# Patient Record
Sex: Female | Born: 1990
Health system: Southern US, Community
[De-identification: ages and names within clinical notes are randomized; demographics above are authoritative.]

## PROBLEM LIST (undated history)

## (undated) DIAGNOSIS — R011 Cardiac murmur, unspecified: Secondary | ICD-10-CM

## (undated) DIAGNOSIS — T7840XA Allergy, unspecified, initial encounter: Secondary | ICD-10-CM

## (undated) HISTORY — DX: Allergy, unspecified, initial encounter: T78.40XA

## (undated) HISTORY — DX: Cardiac murmur, unspecified: R01.1

## (undated) HISTORY — PX: NO PAST SURGERIES: SHX2092

---
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
## 2011-10-01 IMAGING — US US ABDOMEN COMPLETE
1 series · 14 of 25 positions shown · non-contrast
Comparison: None.

CLINICAL DATA: Periumbilical pain, nausea, vomiting, diarrhea

ABDOMINAL ULTRASOUND COMPLETE

[Series 1: us abdomen complete · 0.28mm/px · 14 of 75 slices shown]
[im 1/75]
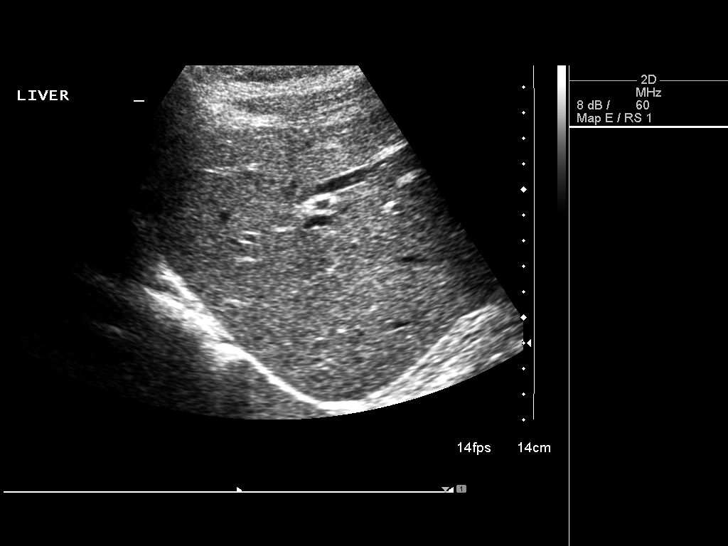
[im 7/75]
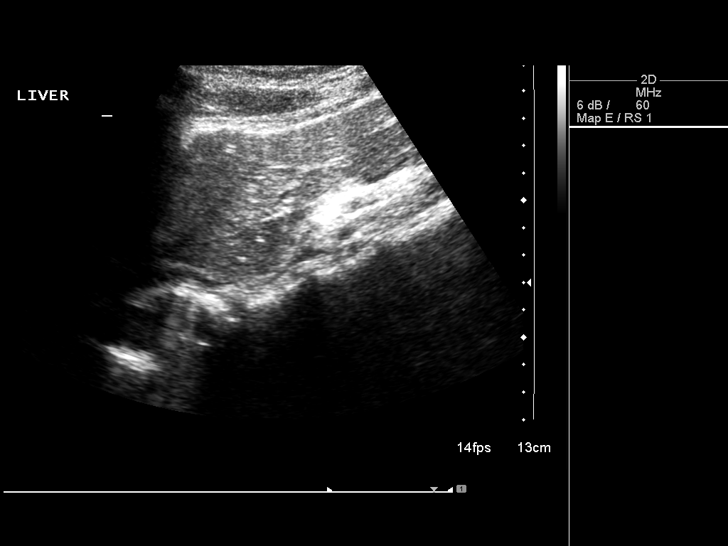
[im 13/75]
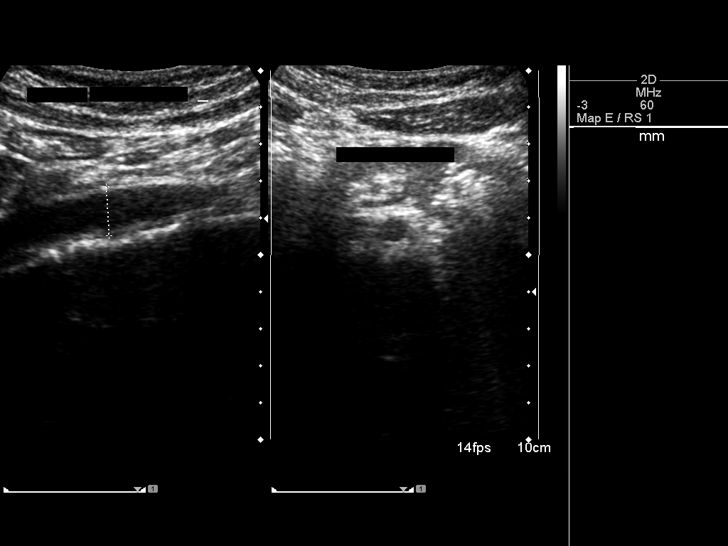
[im 19/75]
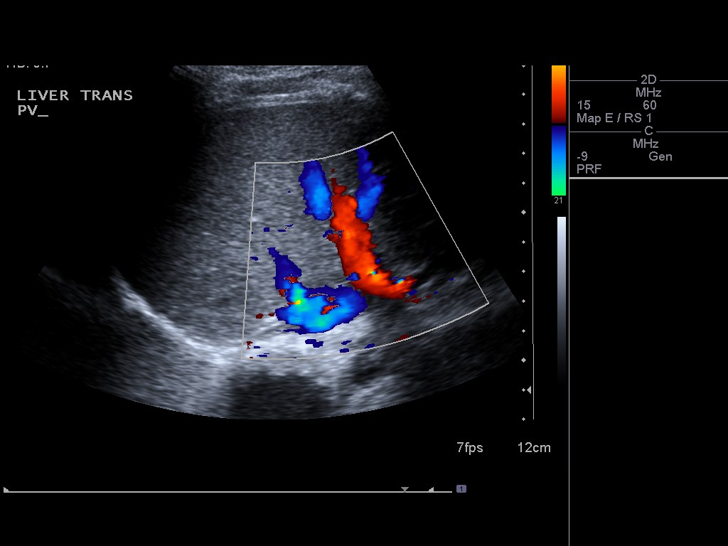
[im 25/75]
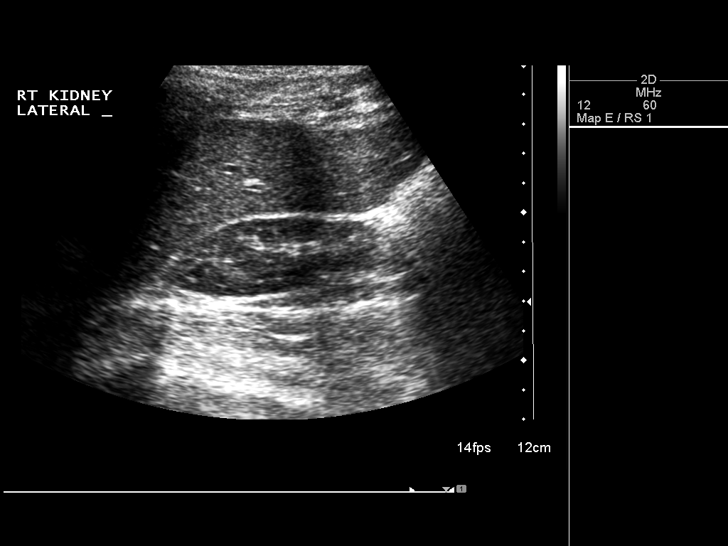
[im 28/75]
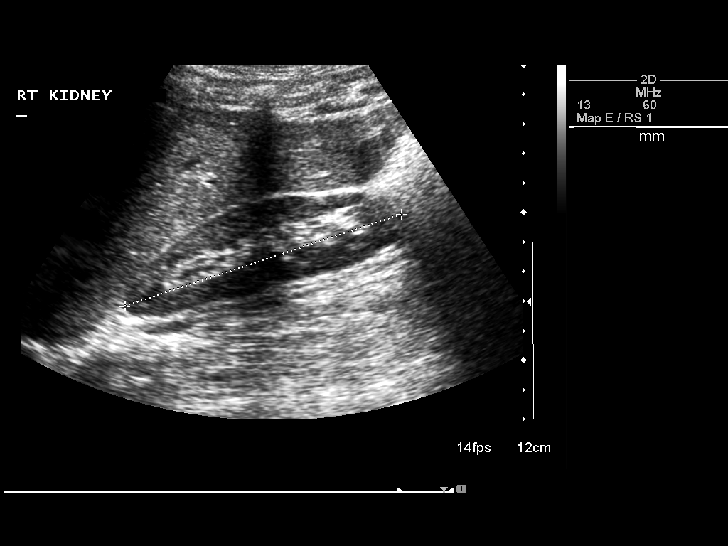
[im 34/75]
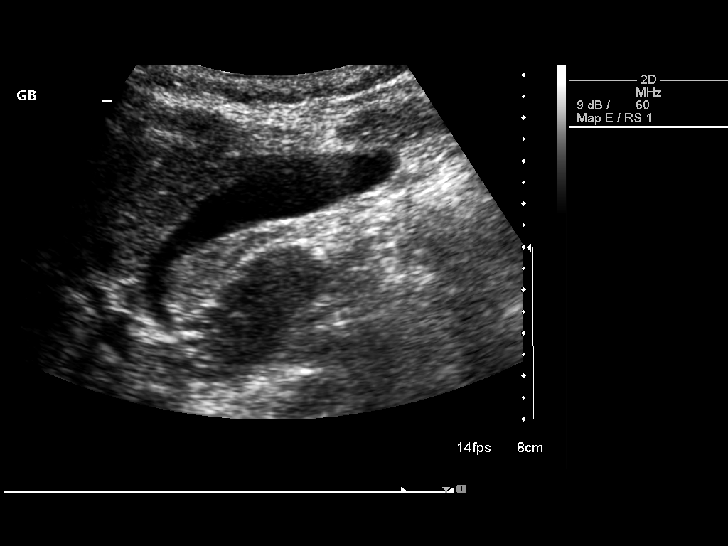
[im 41/75]
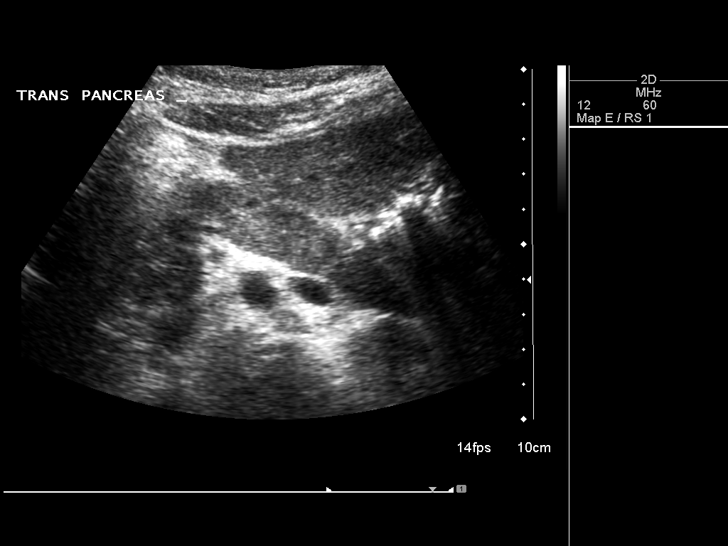
[im 47/75]
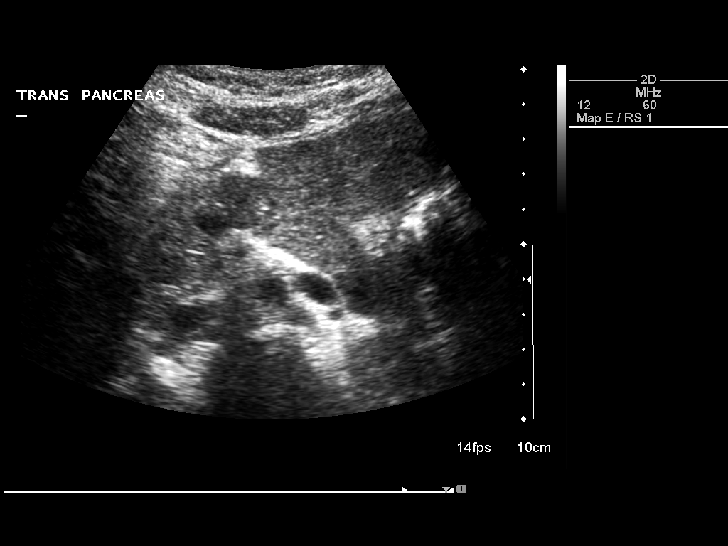
[im 50/75]
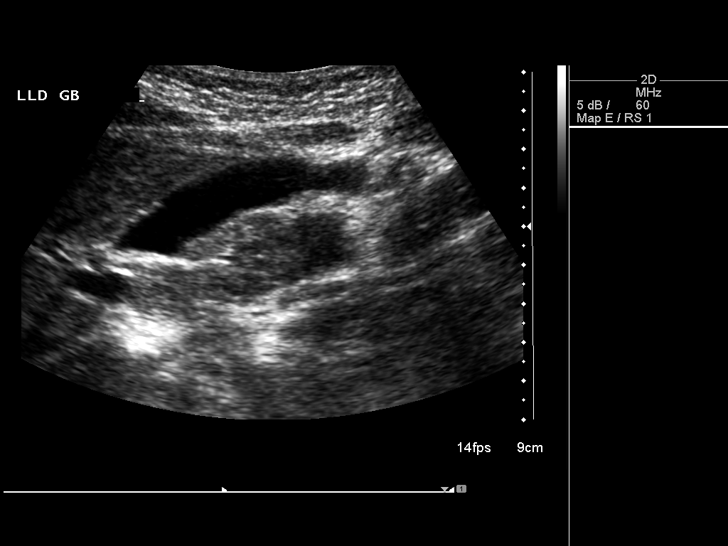
[im 56/75]
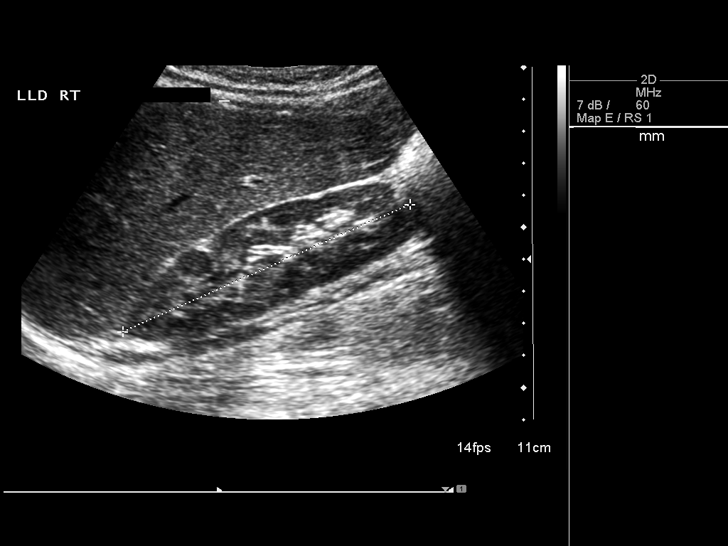
[im 62/75]
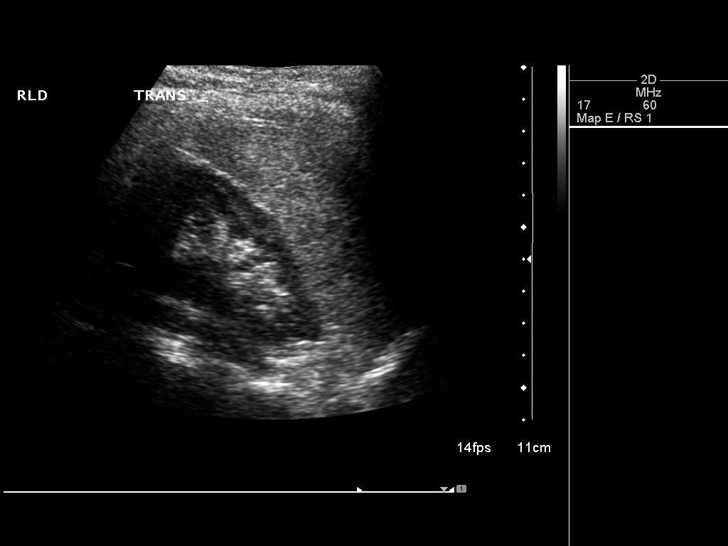
[im 68/75]
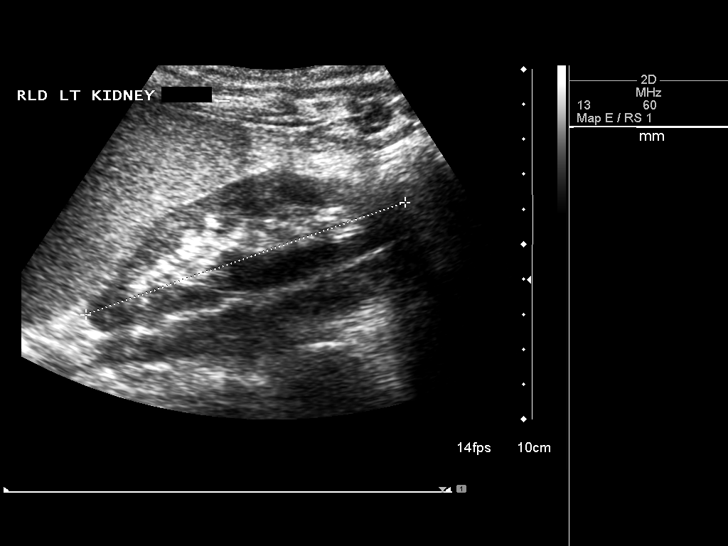
[im 75/75]
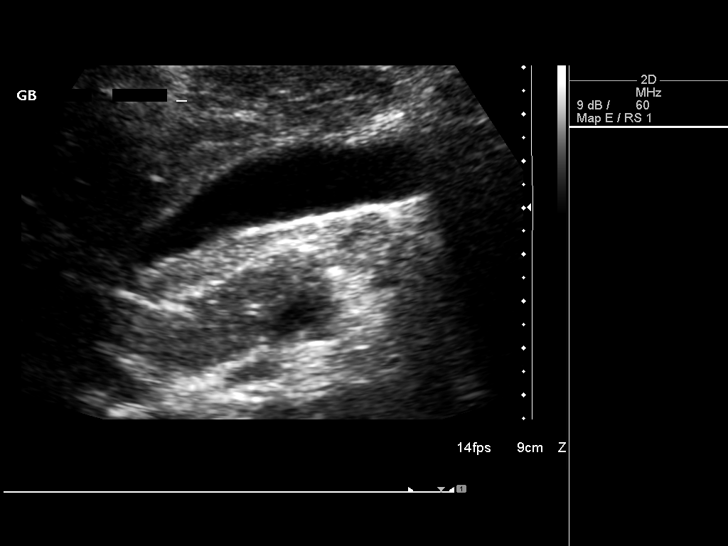

[14 of 25 positions shown; findings below may reference images not displayed]

FINDINGS: Gallbladder:  No gallstones, gallbladder wall thickening, or
pericholecystic fluid.

Common Bile Duct:  Within normal limits in caliber.

Liver: No focal mass lesion identified.  Within normal limits in
parenchymal echogenicity.

IVC:  Appears normal.

Pancreas:  No abnormality identified, although entire pancreas
cannot be visualized by ultrasound.

Spleen:  Within normal limits in size and echotexture.

Right kidney:  Normal in size and parenchymal echogenicity.  No
evidence of mass or hydronephrosis.

Left kidney:  Normal in size and parenchymal echogenicity.  No
evidence of mass or hydronephrosis.

Abdominal Aorta:  No aneurysm identified.
IMPRESSION: Negative abdominal ultrasound.

## 2011-11-09 ENCOUNTER — Ambulatory Visit (INDEPENDENT_AMBULATORY_CARE_PROVIDER_SITE_OTHER): Payer: BC Managed Care – PPO | Admitting: Physician Assistant

## 2011-11-09 VITALS — BP 96/78 | HR 82 | Temp 99.0°F | Resp 16

## 2011-11-09 DIAGNOSIS — Z23 Encounter for immunization: Secondary | ICD-10-CM

## 2011-11-09 DIAGNOSIS — Z111 Encounter for screening for respiratory tuberculosis: Secondary | ICD-10-CM

## 2011-11-12 ENCOUNTER — Encounter: Payer: BC Managed Care – PPO | Admitting: Physician Assistant

## 2011-11-13 LAB — TB SKIN TEST
Induration: 0 mm
TB Skin Test: NEGATIVE

## 2011-11-20 ENCOUNTER — Ambulatory Visit (INDEPENDENT_AMBULATORY_CARE_PROVIDER_SITE_OTHER): Payer: BC Managed Care – PPO | Admitting: Physician Assistant

## 2011-11-20 VITALS — BP 98/64 | HR 76 | Temp 98.5°F | Resp 16 | Ht 62.0 in | Wt 99.0 lb

## 2011-11-20 DIAGNOSIS — Z111 Encounter for screening for respiratory tuberculosis: Secondary | ICD-10-CM

## 2011-11-20 DIAGNOSIS — Z043 Encounter for examination and observation following other accident: Secondary | ICD-10-CM

## 2011-11-22 ENCOUNTER — Encounter (INDEPENDENT_AMBULATORY_CARE_PROVIDER_SITE_OTHER): Payer: BC Managed Care – PPO

## 2011-11-22 DIAGNOSIS — Z111 Encounter for screening for respiratory tuberculosis: Secondary | ICD-10-CM

## 2011-11-22 LAB — TB SKIN TEST: TB Skin Test: NEGATIVE

## 2012-02-07 NOTE — Progress Notes (Signed)
2-step PPD placement

## 2012-02-13 NOTE — Progress Notes (Signed)
Needs 1st TB skin test (requires 2-step).

## 2015-03-24 ENCOUNTER — Ambulatory Visit (INDEPENDENT_AMBULATORY_CARE_PROVIDER_SITE_OTHER): Payer: 59 | Admitting: Physician Assistant

## 2015-03-24 ENCOUNTER — Encounter: Payer: Self-pay | Admitting: Physician Assistant

## 2015-03-24 VITALS — BP 90/60 | HR 97 | Temp 98.6°F | Resp 16 | Ht 62.75 in | Wt 102.6 lb

## 2015-03-24 DIAGNOSIS — G43009 Migraine without aura, not intractable, without status migrainosus: Secondary | ICD-10-CM | POA: Insufficient documentation

## 2015-03-24 MED ORDER — DICLOFENAC SODIUM 75 MG PO TBEC: 75.0000 mg | DELAYED_RELEASE_TABLET | Freq: Two times a day (BID) | ORAL | Status: DC

## 2015-03-24 NOTE — Progress Notes (Signed)
[facility] [address], [place] [address] [phone]  Date:  03/24/2015   Name:  Sandra Hart   DOB:  Dec 28, 1990   MRN:  [id number]  PCP:  No primary care provider on file.    Chief Complaint: Migraine   History of Present Illness:  This is a 24 y.o. female with PMH migraine headaches who is presenting with complaint of migraine. She is wondering if I can help her determine a trigger. She generally gets headaches 1-2 times a month. Usually lasts no longer than 1 day. She recently had a migraine that lasted 2 days and woke her during the night. She is asymptomatic currently. Advil usually helps but didn't this past time. She took excedrin on the 2nd day which she had never tried before and did help. Her headaches are usually occipital. She has photophobia and sensitivity to smell. No phonophobia. Gets nauseated but no vomiting. No blurred vision. No aura. No weakness or numbness. No neck pain with headaches. Sometimes gets migraines around period, other times not related. LMP 12/2 and most recent migraine started 3 days later. She is on OCP since 2009. She didn't notice decreased freq when she started then. She has never kept track of foods she eats. She drinks occ. No smoking or rec drugs. She states she generally sleeps well and is not tired during the day. Mood is good. Drinks plenty of water. She does not drink caffeine   Review of Systems:  Review of Systems See HPI  There are no active problems to display for this patient.   Prior to Admission medications   Medication Sig Start Date End Date Taking? Authorizing Provider  BIOTIN 5000 PO Take by mouth.   Yes Historical Provider, MD  Multiple Vitamin (MULTIVITAMIN) tablet Take 1 tablet by mouth daily.   Yes Historical Provider, MD  NAPROXEN PO Take by mouth.   Yes Historical Provider, MD  Norethin Ace-Eth Estrad-FE (LARIN 24 FE PO) Take by mouth.   Yes Historical Provider, MD    Allergies   Allergen Reactions  . Sulfa Antibiotics     History reviewed. No pertinent past surgical history.  Social History  Substance Use Topics  . Smoking status: Never Smoker   . Smokeless tobacco: None  . Alcohol Use: None    History reviewed. No pertinent family history.  Medication list has been reviewed and updated.  Physical Examination:  Physical Exam  Constitutional: She is oriented to person, place, and time. She appears well-developed and well-nourished. No distress.  HENT:  Head: Normocephalic and atraumatic.  Right Ear: Hearing, tympanic membrane, external ear and ear canal normal.  Left Ear: Hearing, tympanic membrane, external ear and ear canal normal.  Nose: Nose normal.  Mouth/Throat: Uvula is midline, oropharynx is clear and moist and mucous membranes are normal.  Eyes: Conjunctivae, EOM and lids are normal. Pupils are equal, round, and reactive to light. Right eye exhibits no discharge. Left eye exhibits no discharge. No scleral icterus.  Neck: Trachea normal. No thyromegaly present.  Cardiovascular: Normal rate, regular rhythm, normal heart sounds and normal pulses.   No murmur heard. Pulmonary/Chest: Effort normal and breath sounds normal. No respiratory distress. She has no wheezes. She has no rhonchi. She has no rales.  Musculoskeletal: Normal range of motion.  Lymphadenopathy:    She has no cervical adenopathy.  Neurological: She is alert and oriented to person, place, and time. She has normal strength and normal reflexes. No cranial nerve  deficit or sensory deficit. Gait normal.  Skin: Skin is warm, dry and intact. No lesion and no rash noted.  Psychiatric: She has a normal mood and affect. Her speech is normal and behavior is normal. Thought content normal.   BP 90/60 mmHg  Pulse 97  Temp(Src) 98.6 F (37 C) (Oral)  Resp 16  Ht 5' 2.75" (1.594 m)  Wt 102 lb 9.6 oz (46.539 kg)  BMI 18.32 kg/m2  SpO2 95%  LMP 03/18/2015  Assessment and Plan:  1.  Migraine without aura and without status migrainosus, not intractable No definite trigger. Advised she keep a headache diary to identify a trigger. Advil usually helps although not this past time. Gave rx for voltaren to try. She can also try excedrin. She would likely not benefit from preventative treatment since only gets 1-2 migraines a month. Can always try if freq increases. Neuro exam normal. Return as needed. - diclofenac (VOLTAREN) 75 MG EC tablet; Take 1 tablet (75 mg total) by mouth 2 (two) times daily. As needed for migraine  Dispense: 30 tablet; Refill: 0   Benjaman Pott. Drenda Freeze, MHS [facility]  03/24/2015

## 2015-03-24 NOTE — Patient Instructions (Signed)
Take voltaren at start of migraine. May repeat later in the day if needed. Can also try excedrin. Keep a headache diary to see if you can determine a trigger. Try to get 8 hours of sleep a night and drink at least 64 oz water a day. Return if symptoms worsen.  Migraine Headache A migraine headache is an intense, throbbing pain on one or both sides of your head. A migraine can last for 30 minutes to several hours. CAUSES  The exact cause of a migraine headache is not always known. However, a migraine may be caused when nerves in the brain become irritated and release chemicals that cause inflammation. This causes pain. Certain things may also trigger migraines, such as:  Alcohol.  Smoking.  Stress.  Menstruation.  Aged cheeses.  Foods or drinks that contain nitrates, glutamate, aspartame, or tyramine.  Lack of sleep.  Chocolate.  Caffeine.  Hunger.  Physical exertion.  Fatigue.  Medicines used to treat chest pain (nitroglycerine), birth control pills, estrogen, and some blood pressure medicines. SIGNS AND SYMPTOMS  Pain on one or both sides of your head.  Pulsating or throbbing pain.  Severe pain that prevents daily activities.  Pain that is aggravated by any physical activity.  Nausea, vomiting, or both.  Dizziness.  Pain with exposure to bright lights, loud noises, or activity.  General sensitivity to bright lights, loud noises, or smells. Before you get a migraine, you may get warning signs that a migraine is coming (aura). An aura may include:  Seeing flashing lights.  Seeing bright spots, halos, or zigzag lines.  Having tunnel vision or blurred vision.  Having feelings of numbness or tingling.  Having trouble talking.  Having muscle weakness. DIAGNOSIS  A migraine headache is often diagnosed based on:  Symptoms.  Physical exam.  A CT scan or MRI of your head. These imaging tests cannot diagnose migraines, but they can help rule out other  causes of headaches. TREATMENT Medicines may be given for pain and nausea. Medicines can also be given to help prevent recurrent migraines.  HOME CARE INSTRUCTIONS  Only take over-the-counter or prescription medicines for pain or discomfort as directed by your health care provider. The use of long-term narcotics is not recommended.  Lie down in a dark, quiet room when you have a migraine.  Keep a journal to find out what may trigger your migraine headaches. For example, write down:  What you eat and drink.  How much sleep you get.  Any change to your diet or medicines.  Limit alcohol consumption.  Quit smoking if you smoke.  Get 7-9 hours of sleep, or as recommended by your health care provider.  Limit stress.  Keep lights dim if bright lights bother you and make your migraines worse. SEEK IMMEDIATE MEDICAL CARE IF:   Your migraine becomes severe.  You have a fever.  You have a stiff neck.  You have vision loss.  You have muscular weakness or loss of muscle control.  You start losing your balance or have trouble walking.  You feel faint or pass out.  You have severe symptoms that are different from your first symptoms. MAKE SURE YOU:   Understand these instructions.  Will watch your condition.  Will get help right away if you are not doing well or get worse.   This information is not intended to replace advice given to you by your health care provider. Make sure you discuss any questions you have with your health care provider.  Document Released: 04/02/2005 Document Revised: 04/23/2014 Document Reviewed: 12/08/2012 Elsevier Interactive Patient Education [employer].

## 2015-05-12 MED FILL — LARIN 24 FE 1 MG-20 MCG TAB: 1-20 | 84 days supply | Qty: 84 | Fill #2

## 2015-08-05 MED FILL — LARIN 24 FE 1 MG-20 MCG TAB: 1-20 | 84 days supply | Qty: 84 | Fill #3

## 2015-10-26 DIAGNOSIS — Z01419 Encounter for gynecological examination (general) (routine) without abnormal findings: Secondary | ICD-10-CM | POA: Diagnosis not present

## 2015-10-26 DIAGNOSIS — Z681 Body mass index (BMI) 19 or less, adult: Secondary | ICD-10-CM | POA: Diagnosis not present

## 2015-10-26 MED FILL — LARIN 24 FE 1 MG-20 MCG TAB: 1-20 | 84 days supply | Qty: 84 | Fill #0

## 2015-11-02 ENCOUNTER — Ambulatory Visit (INDEPENDENT_AMBULATORY_CARE_PROVIDER_SITE_OTHER): Payer: 59 | Admitting: Physician Assistant

## 2015-11-02 VITALS — BP 110/76 | HR 104 | Temp 97.4°F | Resp 18 | Ht 62.75 in | Wt 103.0 lb

## 2015-11-02 DIAGNOSIS — R42 Dizziness and giddiness: Secondary | ICD-10-CM | POA: Diagnosis not present

## 2015-11-02 LAB — COMPLETE METABOLIC PANEL WITH GFR
ALBUMIN: 4.6 g/dL (ref 3.6–5.1)
ALK PHOS: 60 U/L (ref 33–115)
ALT: 30 U/L — ABNORMAL HIGH (ref 6–29)
AST: 19 U/L (ref 10–30)
BUN: 13 mg/dL (ref 7–25)
CALCIUM: 10 mg/dL (ref 8.6–10.2)
CO2: 26 mmol/L (ref 20–31)
Chloride: 102 mmol/L (ref 98–110)
Creat: 0.9 mg/dL (ref 0.50–1.10)
GFR, EST NON AFRICAN AMERICAN: 89 mL/min (ref >=60)
Glucose, Bld: 81 mg/dL (ref 65–99)
POTASSIUM: 4.3 mmol/L (ref 3.5–5.3)
SODIUM: 139 mmol/L (ref 135–146)
Total Bilirubin: 0.5 mg/dL (ref 0.2–1.2)
Total Protein: 7.7 g/dL (ref 6.1–8.1)

## 2015-11-02 LAB — POCT CBC
GRANULOCYTE PERCENT: 63 % (ref 37–80)
HCT, POC: 43.3 % (ref 37.7–47.9)
Hemoglobin: 15.4 g/dL (ref 12.2–16.2)
Lymph, poc: 2.2 (ref 0.6–3.4)
MCH, POC: 31.4 pg — AB (ref 27–31.2)
MCHC: 35.7 g/dL — AB (ref 31.8–35.4)
MCV: 88 fL (ref 80–97)
MID (CBC): 0.4 (ref 0–0.9)
MPV: 7.4 fL (ref 0–99.8)
POC GRANULOCYTE: 4.4 (ref 2–6.9)
POC LYMPH %: 31.5 % (ref 10–50)
POC MID %: 5.5 %M (ref 0–12)
Platelet Count, POC: 302 10*3/uL (ref 142–424)
RBC: 4.92 M/uL (ref 4.04–5.48)
RDW, POC: 11.6 %
WBC: 7 10*3/uL (ref 4.6–10.2)

## 2015-11-02 LAB — GLUCOSE, POCT (MANUAL RESULT ENTRY): POC Glucose: 85 mg/dl (ref 70–99)

## 2015-11-02 NOTE — Patient Instructions (Signed)
     IF you received an x-ray today, you will receive an invoice from [facility]. Please contact [facility] at [phone] with questions or concerns regarding your invoice.   IF you received labwork today, you will receive an invoice from [facility]. Please contact [facility] at [phone] with questions or concerns regarding your invoice.   Our billing staff will not be able to assist you with questions regarding bills from these companies.  You will be contacted with the lab results as soon as they are available. The fastest way to get your results is to activate your My Chart account. Instructions are located on the last page of this paperwork. If you have not heard from us regarding the results in 2 weeks, please contact this office.      

## 2015-11-02 NOTE — Progress Notes (Signed)
Sandra Hart  MRN: [id number] DOB: 1990/06/09  Subjective:  Pt presents to clinic with several episodes over the last month of lightheadedness and feeling like she was going to pass out but she has not had a syncopal episode.  They have been with different activities and some have been worse than others.  She gets the sensation that she is going to pass out and then she sits or if her sensation is really bad she will end up laying on the ground and elevating her legs but that has only happened once.  It has been time for a meal with all the episodes except for one where she had just eaten dinner.  She has eaten something with most of the episodes and that makes her feels better.  She does not have any other associated symptoms during this attacks - She does have have an irregular heart rate, no anxiety, no SOB or CP.  If she stands up quickly and will sometimes get this sensation but she is not sure if it happening with these episodes.  She has had this for a while but her current symptoms are worse than this and not always with change in position.  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  Neurological: Positive for light-headedness and headaches (intermittent - sometimes related to symptoms like she will get a headache after the incident). Negative for dizziness, syncope, facial asymmetry and numbness.    Patient Active Problem List   Diagnosis Date Noted  . Migraine without aura and without status migrainosus, not intractable 03/24/2015    Current Outpatient Prescriptions on File Prior to Visit  Medication Sig Dispense Refill  . BIOTIN 5000 PO Take by mouth.    . diclofenac (VOLTAREN) 75 MG EC tablet  Take 1 tablet (75 mg total) by mouth 2 (two) times daily. As needed for migraine 30 tablet 0  . Multiple Vitamin (MULTIVITAMIN) tablet Take 1 tablet by mouth daily.    Marland Kitchen NAPROXEN PO Take by mouth.    . Norethin Ace-Eth Estrad-FE (LARIN 24 FE PO) Take by mouth.     No current facility-administered medications on file prior to visit.    Allergies  Allergen Reactions  . Sulfa Antibiotics     Objective:  BP 110/76 mmHg  Pulse 104  Temp(Src) 97.4 F (36.3 C) (Oral)  Resp 18  Ht 5' 2.75" (1.594 m)  Wt 103 lb (46.72 kg)  BMI 18.39 kg/m2  SpO2 100%  LMP 10/28/2015  Orthostatic VS for the past 24 hrs:  BP- Lying Pulse- Lying BP- Sitting Pulse- Sitting BP- Standing at 0 minutes Pulse- Standing at 0 minutes  11/02/15 1053 113/75 mmHg 88 110/74 mmHg 94 109/77 mmHg 98    Physical Exam  Constitutional: She is oriented to person, place, and time and well-developed, well-nourished, and in no distress.  HENT:  Head: Normocephalic and atraumatic.  Right Ear: Hearing and external ear normal.  Left Ear: Hearing and external ear normal.  Eyes: Conjunctivae are normal.  Neck: Normal range of motion.  Cardiovascular: Normal rate, regular rhythm and normal heart sounds.   No murmur heard. Pulmonary/Chest: Effort normal and breath sounds normal. She has no wheezes.  Neurological:  She is alert and oriented to person, place, and time. Gait normal.  Skin: Skin is warm and dry.  Psychiatric: Mood, memory, affect and judgment normal.  Vitals reviewed.  EKG - NSR without any acute changes  Results for orders placed or performed in visit on 11/02/15  POCT glucose (manual entry)  Result Value Ref Range   POC Glucose 85 70 - 99 mg/dl  POCT CBC  Result Value Ref Range   WBC 7.0 4.6 - 10.2 K/uL   Lymph, poc 2.2 0.6 - 3.4   POC LYMPH PERCENT 31.5 10 - 50 %L   MID (cbc) 0.4 0 - 0.9   POC MID % 5.5 0 - 12 %M   POC Granulocyte 4.4 2 - 6.9   Granulocyte percent 63.0 37 - 80 %G   RBC 4.92 4.04 -  5.48 M/uL   Hemoglobin 15.4 12.2 - 16.2 g/dL   HCT, POC 43.3 37.7 - 47.9 %   MCV 88.0 80 - 97 fL   MCH, POC 31.4 (A) 27 - 31.2 pg   MCHC 35.7 (A) 31.8 - 35.4 g/dL   RDW, POC 11.6 %   Platelet Count, POC 302 142 - 424 K/uL   MPV 7.4 0 - 99.8 fL       Assessment and Plan :  Lightheadedness - Plan: POCT glucose (manual entry), POCT CBC, COMPLETE METABOLIC PANEL WITH GFR, EKG 12-Lead, Orthostatic vital signs   Pt will check her BP and pulse when she is having her symptoms.  She is not orthostatic but her pulse does increase with change in position to maintain her BP.  She will try and increase her water intake while she is at work. She will eat her normal meals and then add a protein snack to decrease the chances of a functional hypoglycemia.  She will continue to track and monitor her symptoms.  She will try and increase her salt slightly in her diet to increase her BP since she is having some symptoms with change in position.    D/w Dr Gillis Ends PA-C  [facility] 11/02/2015 11:08 AM

## 2015-11-04 ENCOUNTER — Telehealth: Payer: Self-pay | Admitting: Emergency Medicine

## 2015-11-04 NOTE — Telephone Encounter (Signed)
-----   Message from Mancel Bale, PA-C sent at 11/02/2015  6:27 PM EDT ----- Labs look good.

## 2015-11-04 NOTE — Telephone Encounter (Signed)
Pt given normal blood work 

## 2016-01-19 MED FILL — LARIN 24 FE 1 MG-20 MCG TAB: 1-20 | 84 days supply | Qty: 84 | Fill #1

## 2016-03-15 MED FILL — NAPROXEN SODIUM 550 MG TAB: 550 | 15 days supply | Qty: 30 | Fill #0

## 2016-04-13 MED FILL — BLISOVI 24 FE TABLET: 1-20 | 84 days supply | Qty: 84 | Fill #2

## 2016-07-06 MED FILL — BLISOVI 24 FE TABLET: 1-20 | 84 days supply | Qty: 84 | Fill #3

## 2016-09-27 MED FILL — NAPROXEN SODIUM 550 MG TAB: 550 | 15 days supply | Qty: 30 | Fill #1

## 2016-09-27 MED FILL — BLISOVI 24 FE TABLET: 1-20 | 84 days supply | Qty: 84 | Fill #4

## 2016-11-19 DIAGNOSIS — Z681 Body mass index (BMI) 19 or less, adult: Secondary | ICD-10-CM | POA: Diagnosis not present

## 2016-11-19 DIAGNOSIS — Z01419 Encounter for gynecological examination (general) (routine) without abnormal findings: Secondary | ICD-10-CM | POA: Diagnosis not present

## 2016-12-24 MED FILL — BLISOVI 24 FE TABLET: 1-20 | 84 days supply | Qty: 84 | Fill #0

## 2017-02-15 MED FILL — NAPROXEN SODIUM 550 MG TAB: 550 | 15 days supply | Qty: 30 | Fill #2

## 2017-03-15 MED FILL — BLISOVI 24 FE TABLET: 1-20 | 84 days supply | Qty: 84 | Fill #1

## 2017-05-27 MED FILL — AMOXICILLIN 500 MG CAPSULE: 500 | 10 days supply | Qty: 40 | Fill #0

## 2017-05-27 MED FILL — CHLORHEXIDINE 0.12% RINSE: 0.12 | 16 days supply | Qty: 473 | Fill #0

## 2017-06-07 MED FILL — BLISOVI 24 FE TABLET: 1-20 | 84 days supply | Qty: 84 | Fill #2

## 2017-06-13 ENCOUNTER — Other Ambulatory Visit: Payer: Self-pay

## 2017-06-13 ENCOUNTER — Ambulatory Visit (INDEPENDENT_AMBULATORY_CARE_PROVIDER_SITE_OTHER): Payer: 59 | Admitting: Family Medicine

## 2017-06-13 ENCOUNTER — Encounter: Payer: Self-pay | Admitting: Family Medicine

## 2017-06-13 VITALS — BP 108/64 | HR 104 | Temp 99.4°F | Resp 16 | Ht 62.75 in | Wt 102.6 lb

## 2017-06-13 DIAGNOSIS — J029 Acute pharyngitis, unspecified: Secondary | ICD-10-CM | POA: Diagnosis not present

## 2017-06-13 DIAGNOSIS — J111 Influenza due to unidentified influenza virus with other respiratory manifestations: Secondary | ICD-10-CM

## 2017-06-13 LAB — POCT RAPID STREP A (OFFICE): RAPID STREP A SCREEN: NEGATIVE

## 2017-06-13 MED ORDER — OSELTAMIVIR PHOSPHATE 75 MG PO CAPS: 75.0000 mg | ORAL_CAPSULE | Freq: Two times a day (BID) | ORAL | 0 refills | Status: AC

## 2017-06-13 MED FILL — OSELTAMIVIR PHOSPHATE 75 MG: 75 | 5 days supply | Qty: 10 | Fill #0

## 2017-06-13 NOTE — Progress Notes (Signed)
  Chief Complaint  Patient presents with  . 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  Current Outpatient Medications  Medication Sig Dispense Refill  . Multiple Vitamin (MULTIVITAMIN) tablet Take 1 tablet by mouth daily.    Marland Kitchen NAPROXEN PO Take by mouth.     No current facility-administered medications for this visit.     Allergies:  Allergies  Allergen Reactions  . Sulfa Antibiotics     No past surgical history on file.  Social History   Socioeconomic History  . Marital status: Married    Spouse name: Not on file  . Number of children: Not on file  . Years of education: Not on file  . Highest education level: Not on file  Social Needs  . Financial resource strain: Not on file  . Food insecurity - worry: Not on file  . Food insecurity - inability: Not on file  . Transportation needs - medical: Not on file  . Transportation needs - non-medical: Not on file  Occupational History  . Occupation: Optician, dispensing: [employer]  Tobacco Use  . Smoking status: Never Smoker  . Smokeless tobacco: Never Used  Substance and Sexual Activity  . Alcohol use: Not on file  . Drug use: Not on file  . Sexual activity: Not on file  Other Topics Concern  . 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   ROS Review of Systems See HPI Constitution:see hpi Skin: No rash or itching Eyes: no blurry vision, no double vision GU: no dysuria or hematuria Neuro: no dizziness or headaches all others reviewed and negative   Objective: Vitals:   06/13/17 1631  BP: 108/64  Pulse: (!) 104  Resp: 16  Temp: 99.4 F (37.4 C)  TempSrc: Oral  SpO2: 98%  Weight: 102 lb 9.6 oz (46.5 kg)  Height: 5' 2.75" (1.594 m)    Physical Exam General: alert, oriented, in NAD Head: normocephalic, atraumatic, no sinus tenderness Eyes: EOM intact, no scleral icterus or conjunctival injection Ears: TM clear bilaterally Nose: mucosa nonerythematous, nonedematous Throat: no pharyngeal exudate or erythema Lymph: no posterior auricular, submental or cervical lymph adenopathy Heart: normal rate, normal sinus rhythm, no murmurs Lungs: clear to auscultation bilaterally, no wheezing   Assessment and Plan Sandra Hart was seen today for sore throat/fever.  Diagnoses and all orders for this visit:  Influenza- based on symptoms and vitals will treat for influenza a Work note given Reviewed tamiflu use and side effects  Acute sore throat- rapid strep negative -     POCT rapid strep A  Other orders -     Cancel: Flu Vaccine QUAD 36+ mos IM -     oseltamivir (TAMIFLU) 75 MG capsule; Take 1 capsule (75 mg total) by mouth 2 (two) times daily for 5 days.     [name]

## 2017-06-13 NOTE — Patient Instructions (Addendum)
If you have thyroid disease, diabetes, hypertension, tachycardia or seizure disorder If you take heart meds or ADD meds   You should not take Dextromorphan or Phenylephrine These are common found in decongestant and cold medications This can cause very high pulses and high blood pressure.  Ask you pharmacist to make sure this ingredient is not present    Influenza, Adult Influenza, more commonly known as "the flu," is a viral infection that primarily affects the respiratory tract. The respiratory tract includes organs that help you breathe, such as the lungs, nose, and throat. The flu causes many common cold symptoms, as well as a high fever and body aches. The flu spreads easily from person to person (is contagious). Getting a flu shot (influenza vaccination) every year is the best way to prevent influenza. What are the causes? Influenza is caused by a virus. You can catch the virus by:  Breathing in droplets from an infected person's cough or sneeze.  Touching something that was recently contaminated with the virus and then touching your mouth, nose, or eyes.  What increases the risk? The following factors may make you more likely to get the flu:  Not cleaning your hands frequently with soap and water or alcohol-based hand sanitizer.  Having close contact with many people during cold and flu season.  Touching your mouth, eyes, or nose without washing or sanitizing your hands first.  Not drinking enough fluids or not eating a healthy diet.  Not getting enough sleep or exercise.  Being under a high amount of stress.  Not getting a yearly (annual) flu shot.  You may be at a higher risk of complications from the flu, such as a severe lung infection (pneumonia), if you:  Are over the age of 67.  Are pregnant.  Have a weakened disease-fighting system (immune system). You may have a weakened immune system if you: ? Have HIV or AIDS. ? Are undergoing chemotherapy. ? Aretaking  medicines that reduce the activity of (suppress) the immune system.  Have a long-term (chronic) illness, such as heart disease, kidney disease, diabetes, or lung disease.  Have a liver disorder.  Are obese.  Have anemia.  What are the signs or symptoms? Symptoms of this condition typically last 4-10 days and may include:  Fever.  Chills.  Headache, body aches, or muscle aches.  Sore throat.  Cough.  Runny or congested nose.  Chest discomfort and cough.  Poor appetite.  Weakness or tiredness (fatigue).  Dizziness.  Nausea or vomiting.  How is this diagnosed? This condition may be diagnosed based on your medical history and a physical exam. Your health care provider may do a nose or throat swab test to confirm the diagnosis. How is this treated? If influenza is detected early, you can be treated with antiviral medicine that can reduce the length of your illness and the severity of your symptoms. This medicine may be given by mouth (orally) or through an IV tube that is inserted in one of your veins. The goal of treatment is to relieve symptoms by taking care of yourself at home. This may include taking over-the-counter medicines, drinking plenty of fluids, and adding humidity to the air in your home. In some cases, influenza goes away on its own. Severe influenza or complications from influenza may be treated in a hospital. Follow these instructions at home:  Take over-the-counter and prescription medicines only as told by your health care provider.  Use a cool mist humidifier to add humidity to  the air in your home. This can make breathing easier.  Rest as needed.  Drink enough fluid to keep your urine clear or pale yellow.  Cover your mouth and nose when you cough or sneeze.  Wash your hands with soap and water often, especially after you cough or sneeze. If soap and water are not available, use hand sanitizer.  Stay home from work or school as told by your  health care provider. Unless you are visiting your health care provider, try to avoid leaving home until your fever has been gone for 24 hours without the use of medicine.  Keep all follow-up visits as told by your health care provider. This is important. How is this prevented?  Getting an annual flu shot is the best way to avoid getting the flu. You may get the flu shot in late summer, fall, or winter. Ask your health care provider when you should get your flu shot.  Wash your hands often or use hand sanitizer often.  Avoid contact with people who are sick during cold and flu season.  Eat a healthy diet, drink plenty of fluids, get enough sleep, and exercise regularly. Contact a health care provider if:  You develop new symptoms.  You have: ? Chest pain. ? Diarrhea. ? A fever.  Your cough gets worse.  You produce more mucus.  You feel nauseous or you vomit. Get help right away if:  You develop shortness of breath or difficulty breathing.  Your skin or nails turn a bluish color.  You have severe pain or stiffness in your neck.  You develop a sudden headache or sudden pain in your face or ear.  You cannot stop vomiting. This information is not intended to replace advice given to you by your health care provider. Make sure you discuss any questions you have with your health care provider. Document Released: 03/30/2000 Document Revised: 09/08/2015 Document Reviewed: 01/25/2015 Elsevier Interactive Patient Education  2017 [employer].

## 2017-08-30 MED FILL — BLISOVI 24 FE TABLET: 1-20 | 84 days supply | Qty: 84 | Fill #3

## 2017-09-30 MED FILL — NAPROXEN SODIUM 550 MG TAB: 550 | 15 days supply | Qty: 30 | Fill #0

## 2017-11-20 MED FILL — BLISOVI 24 FE TABLET: 1-20 | 84 days supply | Qty: 84 | Fill #0

## 2017-12-24 DIAGNOSIS — Z681 Body mass index (BMI) 19 or less, adult: Secondary | ICD-10-CM | POA: Diagnosis not present

## 2017-12-24 DIAGNOSIS — Z01419 Encounter for gynecological examination (general) (routine) without abnormal findings: Secondary | ICD-10-CM | POA: Diagnosis not present

## 2018-02-11 DIAGNOSIS — H52222 Regular astigmatism, left eye: Secondary | ICD-10-CM | POA: Diagnosis not present

## 2018-02-11 DIAGNOSIS — H5213 Myopia, bilateral: Secondary | ICD-10-CM | POA: Diagnosis not present

## 2018-02-12 MED FILL — BLISOVI 24 FE TABLET: 1-20 | 84 days supply | Qty: 84 | Fill #0

## 2018-05-01 MED FILL — BLISOVI 24 FE 1-20 MG-MCG(2: 1-20 | 84 days supply | Qty: 84 | Fill #1

## 2018-05-29 MED FILL — NAPROXEN SODIUM 550 MG TABS: 550 | 15 days supply | Qty: 30 | Fill #1

## 2018-06-05 DIAGNOSIS — S0501XA Injury of conjunctiva and corneal abrasion without foreign body, right eye, initial encounter: Secondary | ICD-10-CM | POA: Diagnosis not present

## 2018-07-17 MED FILL — BLISOVI 24 FE 1-20 MG-MCG(2: 1-20 | 84 days supply | Qty: 84 | Fill #2

## 2018-10-21 MED FILL — BLISOVI 24 FE 1-20 MG-MCG(2: 1-20 | 84 days supply | Qty: 84 | Fill #3

## 2018-11-25 ENCOUNTER — Ambulatory Visit (INDEPENDENT_AMBULATORY_CARE_PROVIDER_SITE_OTHER): Payer: 59 | Admitting: Sports Medicine

## 2018-11-25 ENCOUNTER — Other Ambulatory Visit: Payer: Self-pay

## 2018-11-25 ENCOUNTER — Encounter: Payer: Self-pay | Admitting: Sports Medicine

## 2018-11-25 VITALS — BP 100/60 | Ht 62.0 in | Wt 100.0 lb

## 2018-11-25 DIAGNOSIS — M7741 Metatarsalgia, right foot: Secondary | ICD-10-CM | POA: Diagnosis not present

## 2018-11-25 DIAGNOSIS — M7742 Metatarsalgia, left foot: Secondary | ICD-10-CM

## 2018-11-25 DIAGNOSIS — M216X1 Other acquired deformities of right foot: Secondary | ICD-10-CM | POA: Diagnosis not present

## 2018-11-25 DIAGNOSIS — M216X2 Other acquired deformities of left foot: Secondary | ICD-10-CM

## 2018-11-25 NOTE — Progress Notes (Signed)
   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  She has had this pain ever since she started nursing school about 8 years ago.  She has played with purchasing different shoes and over-the-counter orthotics with minimal relief.  She cycles between a pair of sneakers, and 2 pairs of more rigid soled shoes that she wears at work.  Aggravating factors include the day after a 12-hour shift or doing a cardio workout at the gym.  Alleviating factors include rest.  He denies any injury to her feet, ankle, knees, or back.  She denies numbness, weakness, radiating pain, skin changes, swelling, spasm.     ROS:     See HPI.   PERTINENT  PMH / PSH / FH / SH:  Past Medical, Surgical, Social, and Family History Reviewed & Updated in the EMR.  Pertinent findings include:  None   OBJECTIVE:  BP 100/60   Ht 5\' 2"  (1.575 m)   Wt 100 lb (45.4 kg)   BMI 18.29 kg/m  Physical Exam:  Vital signs are reviewed.   GEN: Alert and oriented, NAD Pulm: Breathing unlabored PSY: normal mood, congruent affect  MSK: Bilateral foot exam: Sensation intact bilaterally Full strength and range of motion in ankle and toes Navicular drop on his right foot with full weightbearing Wide forefoot bilaterally, worse on right with transverse arch collapse Left callus between fourth and fifth metatarsal heads on bottom of foot Pronates on right foot, worse than the left when ambulating Neurovascularly intact  ASSESSMENT & PLAN:   1. Bilateral feet pain Likely contributed by metatarsalgia with flattened transverse  arch.  Given the chronicity of her symptoms and physical exam findings as above we provided patient with green orthotics with metatarsal and scaphoid pads for both feet.  She has had no issues with immediate relief when walking on new orthotics.  She will follow-up in 1 month, if she noted relief from over-the-counter orthotic, can consider making custom orthotics.   Lanier Clam, DO, ATC Sports Medicine Fellow  Patient seen and evaluated with the sports medicine fellow.  I agree with the above plan of care.  Patient definitely needs longitudinal arch support.  She also has collapse of the transverse arches bilaterally.  We are going to start with a simple green sports insole with a scaphoid pad and metatarsal pad.  Patient will return to the office in 1 month for follow-up.  If she finds the temporary orthotics to be comfortable then we will plan on making custom orthotics at follow-up.

## 2018-12-25 ENCOUNTER — Ambulatory Visit (INDEPENDENT_AMBULATORY_CARE_PROVIDER_SITE_OTHER): Payer: 59 | Admitting: Sports Medicine

## 2018-12-25 ENCOUNTER — Other Ambulatory Visit: Payer: Self-pay

## 2018-12-25 VITALS — BP 102/62 | Ht 62.0 in | Wt 105.0 lb

## 2018-12-25 DIAGNOSIS — M216X1 Other acquired deformities of right foot: Secondary | ICD-10-CM | POA: Diagnosis not present

## 2018-12-25 DIAGNOSIS — M7742 Metatarsalgia, left foot: Secondary | ICD-10-CM | POA: Diagnosis not present

## 2018-12-25 DIAGNOSIS — M7741 Metatarsalgia, right foot: Secondary | ICD-10-CM

## 2018-12-25 DIAGNOSIS — M216X2 Other acquired deformities of left foot: Secondary | ICD-10-CM | POA: Diagnosis not present

## 2018-12-25 NOTE — Progress Notes (Signed)
Patient presents today for custom orthotics. Please see visit from 8/11 for full history, exam, and assessment.   Patient was fitted for a: standard, cushioned, semi-rigid orthotic. The orthotic was heated and afterward the patient stood on the orthotic blank positioned on the orthotic stand. The patient was positioned in subtalar neutral position and 10 degrees of ankle dorsiflexion in a weight bearing stance. After completion of molding, a stable base was applied to the orthotic blank. The blank was ground to a stable position for weight bearing. Size: 6 Base: blue EVA Posting: none Additional orthotic padding: metatarsal pads bilaterally  Gait was neutral with orthotics in place. Patient found them to be comfortable. Follow-up as needed.  Patient requesting second pair to be made today but we only had one pair of size 6 orthotics. Will call her when we have them back in stock.  Patient seen and evaluated with the sports medicine fellow.  I agree with the above plan of care.  Patient will return to the office in a few days for a second pair of custom orthotics.

## 2019-01-14 MED FILL — NAPROXEN SODIUM 550 MG TABS: 550 | 10 days supply | Qty: 20 | Fill #0

## 2019-01-14 MED FILL — BLISOVI 24 FE 1-20 MG-MCG(2: 1-20 | 28 days supply | Qty: 28 | Fill #0

## 2019-01-15 ENCOUNTER — Other Ambulatory Visit: Payer: Self-pay

## 2019-01-15 ENCOUNTER — Ambulatory Visit (INDEPENDENT_AMBULATORY_CARE_PROVIDER_SITE_OTHER): Payer: 59 | Admitting: Sports Medicine

## 2019-01-15 VITALS — BP 100/62 | Ht 62.0 in | Wt 105.0 lb

## 2019-01-15 DIAGNOSIS — M7741 Metatarsalgia, right foot: Secondary | ICD-10-CM

## 2019-01-15 DIAGNOSIS — M216X2 Other acquired deformities of left foot: Secondary | ICD-10-CM

## 2019-01-15 DIAGNOSIS — M7742 Metatarsalgia, left foot: Secondary | ICD-10-CM

## 2019-01-15 DIAGNOSIS — M216X1 Other acquired deformities of right foot: Secondary | ICD-10-CM

## 2019-01-15 NOTE — Progress Notes (Signed)
  Patient comes in today for a second pair of custom orthotics.  She has a history of metatarsalgia and pes pronation.  Please see previous office notes for details regarding history and physical exam findings.  Custom orthotics were created for her today.  She found them to be comfortable prior to leaving the office.  Gait was neutral with orthotics in place.  Follow-up as needed.  Patient was fitted for a : standard, cushioned, semi-rigid orthotic. The orthotic was heated and afterward the patient stood on the orthotic blank positioned on the orthotic stand. The patient was positioned in subtalar neutral position and 10 degrees of ankle dorsiflexion in a weight bearing stance. After completion of molding, a stable base was applied to the orthotic blank. The blank was ground to a stable position for weight bearing. Size: 6 Base: Blue EVA Posting: none Additional orthotic padding: none

## 2019-01-22 DIAGNOSIS — Z01419 Encounter for gynecological examination (general) (routine) without abnormal findings: Secondary | ICD-10-CM | POA: Diagnosis not present

## 2019-01-22 DIAGNOSIS — Z681 Body mass index (BMI) 19 or less, adult: Secondary | ICD-10-CM | POA: Diagnosis not present

## 2019-01-25 DIAGNOSIS — M545 Low back pain: Secondary | ICD-10-CM | POA: Diagnosis not present

## 2019-02-12 MED FILL — BLISOVI 24 FE 1-20 MG-MCG(2: 1-20 | 84 days supply | Qty: 84 | Fill #0

## 2019-03-19 DIAGNOSIS — L814 Other melanin hyperpigmentation: Secondary | ICD-10-CM | POA: Diagnosis not present

## 2019-03-19 DIAGNOSIS — B070 Plantar wart: Secondary | ICD-10-CM | POA: Diagnosis not present

## 2019-03-19 DIAGNOSIS — L906 Striae atrophicae: Secondary | ICD-10-CM | POA: Diagnosis not present

## 2019-03-19 DIAGNOSIS — L858 Other specified epidermal thickening: Secondary | ICD-10-CM | POA: Diagnosis not present

## 2019-03-19 DIAGNOSIS — D225 Melanocytic nevi of trunk: Secondary | ICD-10-CM | POA: Diagnosis not present

## 2019-04-13 DIAGNOSIS — H5213 Myopia, bilateral: Secondary | ICD-10-CM | POA: Diagnosis not present

## 2019-05-07 MED FILL — NAPROXEN SODIUM 550 MG TABS: 550 | 10 days supply | Qty: 20 | Fill #0

## 2019-05-07 MED FILL — BLISOVI 24 FE 1-20 MG-MCG(2: 1-20 | 84 days supply | Qty: 84 | Fill #1

## 2019-07-30 MED FILL — BLISOVI 24 FE 1-20 MG-MCG(2: 1-20 | 84 days supply | Qty: 84 | Fill #2

## 2019-10-01 MED FILL — NAPROXEN SODIUM 550 MG TABS: 550 | 10 days supply | Qty: 20 | Fill #0

## 2019-10-03 DIAGNOSIS — M542 Cervicalgia: Secondary | ICD-10-CM | POA: Diagnosis not present

## 2019-10-14 MED FILL — TARINA 24 FE 1-20 MG-MCG(24: 1-20 | 84 days supply | Qty: 84 | Fill #3

## 2019-10-20 DIAGNOSIS — M545 Low back pain: Secondary | ICD-10-CM | POA: Diagnosis not present

## 2019-10-20 DIAGNOSIS — M542 Cervicalgia: Secondary | ICD-10-CM | POA: Diagnosis not present

## 2019-10-20 MED FILL — NAPROXEN SODIUM 550 MG TABS: 550 | 20 days supply | Qty: 40 | Fill #0

## 2019-10-20 MED FILL — METHOCARBAMOL 500 MG TABS: 500 | 20 days supply | Qty: 60 | Fill #0

## 2019-10-26 DIAGNOSIS — M542 Cervicalgia: Secondary | ICD-10-CM | POA: Diagnosis not present

## 2019-10-26 DIAGNOSIS — M545 Low back pain: Secondary | ICD-10-CM | POA: Diagnosis not present

## 2019-11-06 DIAGNOSIS — M545 Low back pain: Secondary | ICD-10-CM | POA: Diagnosis not present

## 2019-11-06 DIAGNOSIS — M542 Cervicalgia: Secondary | ICD-10-CM | POA: Diagnosis not present

## 2019-11-11 ENCOUNTER — Other Ambulatory Visit: Payer: Self-pay

## 2019-11-11 ENCOUNTER — Encounter: Payer: Self-pay | Admitting: Dietician

## 2019-11-11 ENCOUNTER — Encounter: Payer: 59 | Attending: Obstetrics and Gynecology | Admitting: Dietician

## 2019-11-11 DIAGNOSIS — M545 Low back pain: Secondary | ICD-10-CM | POA: Diagnosis not present

## 2019-11-11 DIAGNOSIS — M542 Cervicalgia: Secondary | ICD-10-CM | POA: Diagnosis not present

## 2019-11-11 DIAGNOSIS — Z713 Dietary counseling and surveillance: Secondary | ICD-10-CM | POA: Insufficient documentation

## 2019-11-11 NOTE — Patient Instructions (Signed)
Keep an eye on your water intake throughout the day to get a feel for how much you are drinking.   Remember that a balance of carbohydrates and protein in the morning is great for breakfast.   Aim for whole foods (vs processed foods) for more fiber and less added sugar for nutrition and GI health!

## 2019-11-11 NOTE — Progress Notes (Signed)
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 States that convenient food options are best for days that she works. Tries to grab something quick for breakfast so she doesn't wake up her family in the mornings before work. States she is a picky eater, avoids some foods such as tomatoes, nuts, and beans due to texture. Overall eats a variety of foods. Breakfast and snack options may be processed, such as bag of mini muffins, applesauce, Cheez-its, etc. Also eats veggies with meals/snacks, and chooses low-sugar yogurt options.  Asked about good protein shake/powder brands. Asked about pre/post workout foods for energy and to refuel. Asked about appropriate daily fluid intake for her.   Estimated daily fluid intake: unknown  Supplements:MVI, probiotic, biotin  Physical activity:   24-Hr Dietary Recall First Meal:mini muffins + yogurt + OJ (or ham and veggie omelet)  Snack:protein shake (or Cheez-Its, or carrots)  Second Meal:grilled chicken + green beans/ squash/ zucchini + applesauce Snack:-  Third Meal:stir fry chicken (or roast) (or burger)  Snack:couple bites of chocolate  Beverages:water, OJ, sweet tea   Estimated Energy Needs Calories: 2000-2200 Carbohydrate: 250-275g Protein: 125-138g Fat: 67-73g    NUTRITION EDUCATION &GOALS  Track water/fluid intake to determine current daily intake  Pre/post exercise foods   Importance of whole vs processed foods for nutrition and fiber  Balanced breakfasts (carbohydrates + protein) and meals (vegetables + carbohydrates + protein)     Learning Style & Readiness for Change Teaching method  utilized: Visual &Auditory  Demonstrated degree of understanding ERQ:SXQKS Back  Barriers to learning/adherence to lifestyle change: None Identified   Educational Handouts Provided  MyPlate   Protein Shakes/Powders    MONITORING & EVALUATION Dietary intake, weekly physical activity, and goalsat next nutrition visit.  [employer] Employee is toreturn to [facility] for 2nd Employee Wellness visit.

## 2019-11-12 ENCOUNTER — Encounter: Payer: Self-pay | Admitting: Family Medicine

## 2019-11-12 ENCOUNTER — Other Ambulatory Visit: Payer: Self-pay

## 2019-11-12 ENCOUNTER — Ambulatory Visit: Payer: 59 | Admitting: Family Medicine

## 2019-11-12 VITALS — BP 109/77 | HR 95 | Temp 98.3°F | Ht 62.0 in | Wt 108.4 lb

## 2019-11-12 DIAGNOSIS — Z0000 Encounter for general adult medical examination without abnormal findings: Secondary | ICD-10-CM | POA: Diagnosis not present

## 2019-11-12 NOTE — Patient Instructions (Addendum)
   If you have lab work done today you will be contacted with your lab results within the next 2 weeks.  If you have not heard from us then please contact us. The fastest way to get your results is to register for My Chart.   IF you received an x-ray today, you will receive an invoice from [facility]. Please contact [facility] at [phone] with questions or concerns regarding your invoice.   IF you received labwork today, you will receive an invoice from [facility]. Please contact [facility] at [phone] with questions or concerns regarding your invoice.   Our billing staff will not be able to assist you with questions regarding bills from these companies.  You will be contacted with the lab results as soon as they are available. The fastest way to get your results is to activate your My Chart account. Instructions are located on the last page of this paperwork. If you have not heard from us regarding the results in 2 weeks, please contact this office.     Preventive Care 21-39 Years Old, Female Preventive care refers to visits with your health care provider and lifestyle choices that can promote health and wellness. This includes:  A yearly physical exam. This may also be called an annual well check.  Regular dental visits and eye exams.  Immunizations.  Screening for certain conditions.  Healthy lifestyle choices, such as eating a healthy diet, getting regular exercise, not using drugs or products that contain nicotine and tobacco, and limiting alcohol use. What can I expect for my preventive care visit? Physical exam Your health care provider will check your:  Height and weight. This may be used to calculate body mass index (BMI), which tells if you are at a healthy weight.  Heart rate and blood pressure.  Skin for abnormal spots. Counseling Your health care provider may ask you questions about your:  Alcohol, tobacco, and drug use.  Emotional  well-being.  Home and relationship well-being.  Sexual activity.  Eating habits.  Work and work environment.  Method of birth control.  Menstrual cycle.  Pregnancy history. What immunizations do I need?  Influenza (flu) vaccine  This is recommended every year. Tetanus, diphtheria, and pertussis (Tdap) vaccine  You may need a Td booster every 10 years. Varicella (chickenpox) vaccine  You may need this if you have not been vaccinated. Human papillomavirus (HPV) vaccine  If recommended by your health care provider, you may need three doses over 6 months. Measles, mumps, and rubella (MMR) vaccine  You may need at least one dose of MMR. You may also need a second dose. Meningococcal conjugate (MenACWY) vaccine  One dose is recommended if you are age 19-21 years and a first-year college student living in a residence hall, or if you have one of several medical conditions. You may also need additional booster doses. Pneumococcal conjugate (PCV13) vaccine  You may need this if you have certain conditions and were not previously vaccinated. Pneumococcal polysaccharide (PPSV23) vaccine  You may need one or two doses if you smoke cigarettes or if you have certain conditions. Hepatitis A vaccine  You may need this if you have certain conditions or if you travel or work in places where you may be exposed to hepatitis A. Hepatitis B vaccine  You may need this if you have certain conditions or if you travel or work in places where you may be exposed to hepatitis B. Haemophilus influenzae type b (Hib) vaccine  You   may need this if you have certain conditions. You may receive vaccines as individual doses or as more than one vaccine together in one shot (combination vaccines). Talk with your health care provider about the risks and benefits of combination vaccines. What tests do I need?  Blood tests  Lipid and cholesterol levels. These may be checked every 5 years starting at age  20.  Hepatitis C test.  Hepatitis B test. Screening  Diabetes screening. This is done by checking your blood sugar (glucose) after you have not eaten for a while (fasting).  Sexually transmitted disease (STD) testing.  BRCA-related cancer screening. This may be done if you have a family history of breast, ovarian, tubal, or peritoneal cancers.  Pelvic exam and Pap test. This may be done every 3 years starting at age 21. Starting at age 30, this may be done every 5 years if you have a Pap test in combination with an HPV test. Talk with your health care provider about your test results, treatment options, and if necessary, the need for more tests. Follow these instructions at home: Eating and drinking   Eat a diet that includes fresh fruits and vegetables, whole grains, lean protein, and low-fat dairy.  Take vitamin and mineral supplements as recommended by your health care provider.  Do not drink alcohol if: ? Your health care provider tells you not to drink. ? You are pregnant, may be pregnant, or are planning to become pregnant.  If you drink alcohol: ? Limit how much you have to 0-1 drink a day. ? Be aware of how much alcohol is in your drink. In the [place] one drink equals one 12 oz bottle of beer (355 mL), one 5 oz glass of wine (148 mL), or one 1 oz glass of hard liquor (44 mL). Lifestyle  Take daily care of your teeth and gums.  Stay active. Exercise for at least 30 minutes on 5 or more days each week.  Do not use any products that contain nicotine or tobacco, such as cigarettes, e-cigarettes, and chewing tobacco. If you need help quitting, ask your health care provider.  If you are sexually active, practice safe sex. Use a condom or other form of birth control (contraception) in order to prevent pregnancy and STIs (sexually transmitted infections). If you plan to become pregnant, see your health care provider for a preconception visit. What's next?  Visit your health  care provider once a year for a well check visit.  Ask your health care provider how often you should have your eyes and teeth checked.  Stay up to date on all vaccines. This information is not intended to replace advice given to you by your health care provider. Make sure you discuss any questions you have with your health care provider. Document Revised: 12/12/2017 Document Reviewed: 12/12/2017 Elsevier Patient Education  2020 Elsevier Inc.  

## 2019-11-12 NOTE — Progress Notes (Signed)
7/29/20218:32 AM  Sandra Hart 16-Dec-1990, 29 y.o., female [id number]  Chief Complaint  Patient presents with  . Annual Exam    scheduled for pap nx month with [facility]. Will upload covid vacc card to mychart    HPI:   Patient is a 29 y.o. 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 Influenza-Unspecified 01/15/2015  . PPD Test 11/20/2011  . Tdap 04/16/2008   Frequency of Dental evaluation: Q6 months Frequency of Eye evaluation: yearly   Hearing Screening   125Hz  250Hz  500Hz  1000Hz  2000Hz  3000Hz  4000Hz  6000Hz  8000Hz   Right ear:           Left ear:             Visual Acuity Screening   Right eye Left eye Both eyes  Without correction: 20/25 20/20 20/20   With correction:       Health Maintenance Due  Topic Date Due  . PAP-Cervical Cytology Screening  Never done    Depression screen [facility] 2/9 11/11/2019 06/13/2017 11/02/2015  Decreased Interest 0 0 0  Down, Depressed, Hopeless 0 0 0  PHQ - 2 Score 0 0 0    Fall Risk  11/12/2019 11/11/2019 06/13/2017 11/02/2015 03/24/2015  Falls in the past year? 0 0 No No No  Number falls in past yr: 0 - - - -  Injury with Fall? 0 - - - -     Allergies  Allergen Reactions  . Sulfa Antibiotics     Prior to Admission medications   Medication Sig Start Date End Date Taking? Authorizing Provider  Biotin 1 MG CAPS biotin   Yes [provider]  Multiple Vitamin (MULTIVITAMIN) tablet Take 1 tablet by mouth daily.   Yes [provider]  NAPROXEN PO Take by mouth daily as needed.    Yes [provider]  Norethin Ace-Eth Estrad-FE (BLISOVI 24 FE PO) Take 1 mg by mouth  every morning.   Yes [provider]    No past medical history on file.  No past surgical history on file.  Social History   Tobacco Use  . Smoking status: Never Smoker  . Smokeless tobacco: Never Used  Substance Use Topics  . Alcohol use: Not on file    Family History  Problem Relation Age of Onset  . Hypertension Mother   . Hypertension Father   . Healthy Sister     Review of Systems  Constitutional: Negative for chills, fever and malaise/fatigue.  HENT: Negative for hearing loss, sinus pain, sore throat and tinnitus.   Eyes: Negative for blurred vision and double vision.  Respiratory: Negative for cough and shortness of breath.   Cardiovascular: Negative for chest pain, palpitations and leg swelling.  Gastrointestinal: Negative for abdominal pain, blood in stool, constipation, diarrhea, melena, nausea and vomiting.  Genitourinary: Negative for dysuria and hematuria.  Musculoskeletal: Positive for back pain (going to PT, doing cupping). Negative for myalgias.  Skin: Negative for rash.  Neurological: Positive for headaches (migraines, increased frequency in summer (heat and bright sun), resolve with advil or excedrin). Negative for dizziness, sensory change, speech change and focal weakness.  Endo/Heme/Allergies: Negative for polydipsia.  Psychiatric/Behavioral: Negative for depression.  The patient is not nervous/anxious and does not have insomnia.   All other systems reviewed and are negative.    OBJECTIVE:  Today's Vitals   11/12/19 0824  BP: 109/77  Pulse: 95  Temp: 98.3 F (36.8 C)  SpO2: 98%  Weight: 108 lb 6.4 oz (49.2 kg)  Height: 5\' 2"  (1.575 m)   Body mass index is 19.83 kg/m.   Wt Readings from Last 3 Encounters:  11/12/19 108 lb 6.4 oz (49.2 kg)  11/11/19 108 lb 11.2 oz (49.3 kg)  01/15/19 105 lb (47.6 kg)     Physical Exam Vitals and nursing note reviewed.  Constitutional:      Appearance: She is well-developed.  HENT:      Head: Normocephalic and atraumatic.     Right Ear: Hearing, tympanic membrane, ear canal and external ear normal.     Left Ear: Hearing, tympanic membrane, ear canal and external ear normal.     Mouth/Throat:     Mouth: Mucous membranes are moist.     Pharynx: No oropharyngeal exudate or posterior oropharyngeal erythema.  Eyes:     Extraocular Movements: Extraocular movements intact.     Conjunctiva/sclera: Conjunctivae normal.     Pupils: Pupils are equal, round, and reactive to light.  Neck:     Thyroid: No thyromegaly.  Cardiovascular:     Rate and Rhythm: Normal rate and regular rhythm.     Heart sounds: Normal heart sounds. No murmur heard.  No friction rub. No gallop.   Pulmonary:     Effort: Pulmonary effort is normal.     Breath sounds: Normal breath sounds. No wheezing, rhonchi or rales.  Abdominal:     General: Bowel sounds are normal. There is no distension.     Palpations: Abdomen is soft. There is no hepatomegaly, splenomegaly or mass.     Tenderness: There is no abdominal tenderness.  Musculoskeletal:        General: Normal range of motion.     Cervical back: Neck supple.     Right lower leg: No edema.     Left lower leg: No edema.  Lymphadenopathy:     Cervical: No cervical adenopathy.  Skin:    General: Skin is warm and dry.  Neurological:     Mental Status: She is alert and oriented to person, place, and time.     Cranial Nerves: No cranial nerve deficit.     Gait: Gait normal.     Deep Tendon Reflexes: Reflexes are normal and symmetric.  Psychiatric:        Mood and Affect: Mood normal.        Behavior: Behavior normal.     No results found for this or any previous visit (from the past 24 hour(s)).  No results found.   ASSESSMENT and PLAN  1. Annual physical exam No concerns per history or exam.  HCM reviewed/discussed. Anticipatory guidance regarding healthy weight, lifestyle and choices given.   Other orders   Return in about 1 year (around  11/11/2020).    Rutherford Guys, MD [facility] [place], [place] [address] Ph.  [phone] Fax [phone]

## 2019-11-18 DIAGNOSIS — M545 Low back pain: Secondary | ICD-10-CM | POA: Diagnosis not present

## 2019-11-18 DIAGNOSIS — M542 Cervicalgia: Secondary | ICD-10-CM | POA: Diagnosis not present

## 2019-11-19 ENCOUNTER — Encounter: Payer: 59 | Attending: Obstetrics and Gynecology | Admitting: Skilled Nursing Facility1

## 2019-11-19 ENCOUNTER — Other Ambulatory Visit: Payer: Self-pay

## 2019-11-19 DIAGNOSIS — E638 Other specified nutritional deficiencies: Secondary | ICD-10-CM

## 2019-11-19 DIAGNOSIS — Z713 Dietary counseling and surveillance: Secondary | ICD-10-CM | POA: Insufficient documentation

## 2019-11-19 NOTE — Progress Notes (Signed)
[facility] Employee Wellness Visit  Visit2of 3 [phone]  Primary concerns today:employee wellness insurance requirement; general nutrition; exercise nutrition Preferred learning style: no preference indicated Learning readiness: ready   NUTRITION ASSESSMENT  Anthropometrics Weight: 108.7 lbs  Body Composition Scale 11/19/2019  Current Body Weight 108.6  Total Body Fat % 15.1  Visceral Fat 3  Fat-Free Mass % 84.8   Total Body Water % 56.9  Muscle-Mass lbs 29.7  BMI 19.7  Body Fat Displacement          Torso  lbs 10.1         Left Leg  lbs 2.0         Right Leg  lbs 2.0         Left Arm  lbs 1.0         Right Arm   lbs 1.0     Lifestyle & Dietary Hx 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 States that convenient food options are best for days that she works. Tries to grab something quick for breakfast so she doesn't wake up her family in the mornings before work. States she is a picky eater, avoids some foods such as tomatoes, nuts, and beans due to texture. Overall eats a variety of foods. Breakfast and snack options may be processed, such as bag of mini muffins, applesauce, Cheez-its, etc. Also eats veggies with meals/snacks, and chooses low-sugar yogurt options.  Asked about good protein shake/powder brands. Asked about pre/post workout foods for energy and to refuel. Asked about appropriate daily fluid intake for her.   Pt wanted healthy snack ideas and breakfast: options were given inclusive of snack handouts.   Estimated daily fluid intake: unknown  Supplements:MVI, probiotic, biotin  Physical activity:   24-Hr Dietary Recall First Meal:mini muffins + yogurt + OJ (or ham and veggie omelet)  Snack:protein shake (or Cheez-Its, or carrots)  Second Meal:grilled chicken + green beans/ squash/ zucchini + applesauce Snack:-  Third Meal:stir fry chicken (or roast) (or burger)  Snack:couple bites of chocolate  Beverages:water, OJ, sweet tea    Estimated Energy Needs Calories: 2000-2200 Carbohydrate: 250-275g Protein: 125-138g Fat: 67-73g    NUTRITION EDUCATION &GOALS  Track water/fluid intake to determine current daily intake  Pre/post exercise foods   Importance of whole vs processed foods for nutrition and fiber  Balanced breakfasts (carbohydrates + protein) and meals (vegetables + carbohydrates + protein)     chocolate milk for recovery drink  Learning Style & Readiness for Change Teaching method utilized: Visual &Auditory  Demonstrated degree of understanding UPJ:SRPRX Back  Barriers to learning/adherence to lifestyle change: None Identified   Educational Handouts Provided  MyPlate   Protein Shakes/Powders   Snack ideas   MONITORING & EVALUATION Dietary intake, weekly physical activity, and goalsat next nutrition visit.  [employer] Steps Employee is toreturn to [facility] for [place] visit.

## 2019-11-23 ENCOUNTER — Encounter: Payer: 59 | Admitting: Registered"

## 2019-11-23 ENCOUNTER — Other Ambulatory Visit: Payer: Self-pay

## 2019-11-23 ENCOUNTER — Ambulatory Visit: Payer: 59 | Admitting: Dietician

## 2019-11-23 DIAGNOSIS — M545 Low back pain: Secondary | ICD-10-CM | POA: Diagnosis not present

## 2019-11-23 DIAGNOSIS — M542 Cervicalgia: Secondary | ICD-10-CM | POA: Diagnosis not present

## 2019-11-23 DIAGNOSIS — Z713 Dietary counseling and surveillance: Secondary | ICD-10-CM

## 2019-11-23 NOTE — Progress Notes (Signed)
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 States that convenient food options are best for days that she works.   Employee shares cooking responsibility with parents whom she lives with.  Employee states she has a difficult time drinking enough water during shift due to nature of work that doesn't allow her to leave the NICU very often.  Estimated daily fluid intake: unknown  Supplements:MVI, probiotic, biotin  Physical activity: varies trying to get back into in. 1-2x/week. Doing physical therapy 1x week.  24-Hr Dietary Recall First Meal:mini muffins, yogurt, OJ (continues with this routine) Snack:none  Second Meal:choc milk chicken, rice, soup, pita Snack:-  Third Meal:pizza  Snack: Beverages:water, OJ, sweet tea 1x month or less   Estimated Energy Needs Calories: 2000-2200 Carbohydrate: 250-275g Protein: 125-138g Fat: 67-73g    NUTRITION EDUCATION &GOALS New:  Easy meal ideas for lunches  Alternatives to muffins for breakfast  Previous:  Track water/fluid intake to determine current daily intake  Pre/post exercise foods   Importance of whole vs processed foods for nutrition and fiber  Balanced breakfasts (carbohydrates + protein) and meals (vegetables + carbohydrates + protein)     chocolate milk for recovery drink - in progress  Learning Style & Readiness for Change Teaching method utilized: Visual &Auditory  Demonstrated degree of understanding PJK:DTOIZ Back  Barriers to learning/adherence to lifestyle change: None Identified   Educational Handouts Provided  Anatomy of a Theatre stage manager for [employer]   MONITORING &  EVALUATION Dietary intake, weekly physical activity, and goalsat next nutrition visit.  [employer] Employee is toreturn to [facility] for prn visit as needed.

## 2019-11-27 ENCOUNTER — Encounter: Payer: Self-pay | Admitting: Registered"

## 2019-11-27 DIAGNOSIS — Z713 Dietary counseling and surveillance: Secondary | ICD-10-CM | POA: Insufficient documentation

## 2019-12-04 DIAGNOSIS — M545 Low back pain: Secondary | ICD-10-CM | POA: Diagnosis not present

## 2019-12-04 DIAGNOSIS — M542 Cervicalgia: Secondary | ICD-10-CM | POA: Diagnosis not present

## 2019-12-14 ENCOUNTER — Ambulatory Visit: Payer: 59 | Admitting: Dietician

## 2020-01-18 MED FILL — TARINA 24 FE 1-20 MG-MCG(24: 1-20 | 84 days supply | Qty: 84 | Fill #4

## 2020-02-03 ENCOUNTER — Other Ambulatory Visit (HOSPITAL_COMMUNITY): Payer: Self-pay | Admitting: Obstetrics and Gynecology

## 2020-02-03 DIAGNOSIS — Z681 Body mass index (BMI) 19 or less, adult: Secondary | ICD-10-CM | POA: Diagnosis not present

## 2020-02-03 DIAGNOSIS — N946 Dysmenorrhea, unspecified: Secondary | ICD-10-CM | POA: Diagnosis not present

## 2020-02-03 DIAGNOSIS — Z01419 Encounter for gynecological examination (general) (routine) without abnormal findings: Secondary | ICD-10-CM | POA: Diagnosis not present

## 2020-04-06 MED FILL — TARINA 24 FE 1-20 MG-MCG(24: 1-20 | 84 days supply | Qty: 84 | Fill #0

## 2020-04-21 DIAGNOSIS — L812 Freckles: Secondary | ICD-10-CM | POA: Diagnosis not present

## 2020-04-21 DIAGNOSIS — D1801 Hemangioma of skin and subcutaneous tissue: Secondary | ICD-10-CM | POA: Diagnosis not present

## 2020-04-21 DIAGNOSIS — D225 Melanocytic nevi of trunk: Secondary | ICD-10-CM | POA: Diagnosis not present

## 2020-04-21 DIAGNOSIS — L650 Telogen effluvium: Secondary | ICD-10-CM | POA: Diagnosis not present

## 2020-04-21 DIAGNOSIS — B078 Other viral warts: Secondary | ICD-10-CM | POA: Diagnosis not present

## 2020-04-21 DIAGNOSIS — L918 Other hypertrophic disorders of the skin: Secondary | ICD-10-CM | POA: Diagnosis not present

## 2020-05-11 ENCOUNTER — Other Ambulatory Visit (HOSPITAL_COMMUNITY): Payer: Self-pay | Admitting: Sports Medicine

## 2020-05-11 DIAGNOSIS — M5416 Radiculopathy, lumbar region: Secondary | ICD-10-CM | POA: Diagnosis not present

## 2020-05-11 DIAGNOSIS — M25551 Pain in right hip: Secondary | ICD-10-CM | POA: Diagnosis not present

## 2020-05-11 DIAGNOSIS — M25552 Pain in left hip: Secondary | ICD-10-CM | POA: Diagnosis not present

## 2020-05-11 DIAGNOSIS — M5106 Intervertebral disc disorders with myelopathy, lumbar region: Secondary | ICD-10-CM | POA: Diagnosis not present

## 2020-05-11 MED FILL — predniSONE 10 MG TABS: 10 | 9 days supply | Qty: 18 | Fill #0

## 2020-05-31 DIAGNOSIS — M5106 Intervertebral disc disorders with myelopathy, lumbar region: Secondary | ICD-10-CM | POA: Diagnosis not present

## 2020-06-08 DIAGNOSIS — M545 Low back pain, unspecified: Secondary | ICD-10-CM | POA: Diagnosis not present

## 2020-06-29 DIAGNOSIS — M545 Low back pain, unspecified: Secondary | ICD-10-CM | POA: Diagnosis not present

## 2020-07-06 DIAGNOSIS — M545 Low back pain, unspecified: Secondary | ICD-10-CM | POA: Diagnosis not present

## 2020-07-13 DIAGNOSIS — M545 Low back pain, unspecified: Secondary | ICD-10-CM | POA: Diagnosis not present

## 2020-07-18 DIAGNOSIS — M545 Low back pain, unspecified: Secondary | ICD-10-CM | POA: Diagnosis not present

## 2020-07-27 DIAGNOSIS — M545 Low back pain, unspecified: Secondary | ICD-10-CM | POA: Diagnosis not present

## 2020-08-03 DIAGNOSIS — M545 Low back pain, unspecified: Secondary | ICD-10-CM | POA: Diagnosis not present

## 2020-08-12 DIAGNOSIS — M545 Low back pain, unspecified: Secondary | ICD-10-CM | POA: Diagnosis not present

## 2020-08-15 DIAGNOSIS — M545 Low back pain, unspecified: Secondary | ICD-10-CM | POA: Diagnosis not present

## 2020-08-24 DIAGNOSIS — M545 Low back pain, unspecified: Secondary | ICD-10-CM | POA: Diagnosis not present

## 2020-08-31 DIAGNOSIS — M545 Low back pain, unspecified: Secondary | ICD-10-CM | POA: Diagnosis not present

## 2020-09-07 ENCOUNTER — Other Ambulatory Visit (HOSPITAL_COMMUNITY): Payer: Self-pay

## 2020-09-07 DIAGNOSIS — G5702 Lesion of sciatic nerve, left lower limb: Secondary | ICD-10-CM | POA: Diagnosis not present

## 2020-09-07 DIAGNOSIS — M545 Low back pain, unspecified: Secondary | ICD-10-CM | POA: Diagnosis not present

## 2020-09-07 MED ORDER — NAPROXEN SODIUM 550 MG PO TABS
550.0000 mg | ORAL_TABLET | Freq: Two times a day (BID) | ORAL | 2 refills | Status: DC | PRN
  Filled 2020-09-07: qty 40, 20d supply, fill #0
  Filled 2021-07-26: qty 40, 20d supply, fill #1

## 2020-09-08 ENCOUNTER — Other Ambulatory Visit (HOSPITAL_COMMUNITY): Payer: Self-pay

## 2020-09-23 ENCOUNTER — Other Ambulatory Visit (HOSPITAL_COMMUNITY): Payer: Self-pay

## 2020-09-23 DIAGNOSIS — R200 Anesthesia of skin: Secondary | ICD-10-CM | POA: Diagnosis not present

## 2020-09-23 MED ORDER — GABAPENTIN 300 MG PO CAPS
300.0000 mg | ORAL_CAPSULE | Freq: Three times a day (TID) | ORAL | 1 refills | Status: DC
  Filled 2020-09-23: qty 90, 30d supply, fill #0
  Filled 2020-11-15: qty 90, 30d supply, fill #1

## 2020-09-23 MED FILL — Norethindrone Ace-Ethinyl Estradiol-FE Tab 1 MG-20 MCG (24): ORAL | 84 days supply | Qty: 84 | Fill #0 | Status: AC

## 2020-09-28 DIAGNOSIS — H5213 Myopia, bilateral: Secondary | ICD-10-CM | POA: Diagnosis not present

## 2020-11-15 ENCOUNTER — Other Ambulatory Visit (HOSPITAL_COMMUNITY): Payer: Self-pay

## 2020-11-15 DIAGNOSIS — M545 Low back pain, unspecified: Secondary | ICD-10-CM | POA: Diagnosis not present

## 2020-11-28 DIAGNOSIS — M545 Low back pain, unspecified: Secondary | ICD-10-CM | POA: Diagnosis not present

## 2020-12-12 DIAGNOSIS — M545 Low back pain, unspecified: Secondary | ICD-10-CM | POA: Diagnosis not present

## 2020-12-15 ENCOUNTER — Other Ambulatory Visit (HOSPITAL_COMMUNITY): Payer: Self-pay

## 2020-12-15 MED FILL — Norethindrone Ace-Ethinyl Estradiol-FE Tab 1 MG-20 MCG (24): ORAL | 84 days supply | Qty: 84 | Fill #1 | Status: AC

## 2020-12-28 DIAGNOSIS — M545 Low back pain, unspecified: Secondary | ICD-10-CM | POA: Diagnosis not present

## 2021-01-04 DIAGNOSIS — M545 Low back pain, unspecified: Secondary | ICD-10-CM | POA: Diagnosis not present

## 2021-01-11 DIAGNOSIS — M545 Low back pain, unspecified: Secondary | ICD-10-CM | POA: Diagnosis not present

## 2021-01-19 DIAGNOSIS — M545 Low back pain, unspecified: Secondary | ICD-10-CM | POA: Diagnosis not present

## 2021-01-25 DIAGNOSIS — M545 Low back pain, unspecified: Secondary | ICD-10-CM | POA: Diagnosis not present

## 2021-01-27 ENCOUNTER — Other Ambulatory Visit (HOSPITAL_BASED_OUTPATIENT_CLINIC_OR_DEPARTMENT_OTHER): Payer: Self-pay

## 2021-01-27 ENCOUNTER — Ambulatory Visit: Payer: 59 | Attending: Internal Medicine

## 2021-01-27 DIAGNOSIS — Z23 Encounter for immunization: Secondary | ICD-10-CM

## 2021-01-27 MED ORDER — INFLUENZA VAC SPLIT QUAD 0.5 ML IM SUSY
PREFILLED_SYRINGE | INTRAMUSCULAR | 0 refills | Status: DC
Start: 2021-01-27 — End: 2021-08-22
  Filled 2021-01-27: qty 0.5, 1d supply, fill #0

## 2021-01-27 NOTE — Progress Notes (Signed)
   Covid-19 Vaccination Clinic  Name:  Sandra Hart    MRN: [id number] DOB: 1990-06-09  01/27/2021  [name] was observed post Covid-19 immunization for 15 minutes without incident. She was provided with Vaccine Information Sheet and instruction to access the V-Safe system.   [name] was instructed to call 911 with any severe reactions post vaccine: Difficulty breathing  Swelling of face and throat  A fast heartbeat  A bad rash all over body  Dizziness and weakness

## 2021-01-31 DIAGNOSIS — M545 Low back pain, unspecified: Secondary | ICD-10-CM | POA: Diagnosis not present

## 2021-02-03 ENCOUNTER — Other Ambulatory Visit (HOSPITAL_COMMUNITY): Payer: Self-pay

## 2021-02-03 DIAGNOSIS — N946 Dysmenorrhea, unspecified: Secondary | ICD-10-CM | POA: Diagnosis not present

## 2021-02-03 DIAGNOSIS — Z681 Body mass index (BMI) 19 or less, adult: Secondary | ICD-10-CM | POA: Diagnosis not present

## 2021-02-03 DIAGNOSIS — Z01419 Encounter for gynecological examination (general) (routine) without abnormal findings: Secondary | ICD-10-CM | POA: Diagnosis not present

## 2021-02-03 LAB — RESULTS CONSOLE HPV: CHL HPV: NEGATIVE

## 2021-02-03 LAB — HM PAP SMEAR: HM Pap smear: NEGATIVE

## 2021-02-03 MED ORDER — BLISOVI 24 FE 1-20 MG-MCG(24) PO TABS
ORAL_TABLET | ORAL | 4 refills | Status: DC
  Filled 2021-02-03 – 2021-03-08 (×2): qty 84, 84d supply, fill #0
  Filled 2021-06-01: qty 84, 84d supply, fill #1

## 2021-02-03 MED ORDER — NAPROXEN SODIUM 550 MG PO TABS
550.0000 mg | ORAL_TABLET | Freq: Two times a day (BID) | ORAL | 3 refills | Status: DC | PRN
  Filled 2021-02-03: qty 30, 15d supply, fill #0

## 2021-02-06 ENCOUNTER — Other Ambulatory Visit (HOSPITAL_COMMUNITY): Payer: Self-pay

## 2021-02-06 DIAGNOSIS — M545 Low back pain, unspecified: Secondary | ICD-10-CM | POA: Diagnosis not present

## 2021-02-14 DIAGNOSIS — M545 Low back pain, unspecified: Secondary | ICD-10-CM | POA: Diagnosis not present

## 2021-02-17 ENCOUNTER — Other Ambulatory Visit (HOSPITAL_BASED_OUTPATIENT_CLINIC_OR_DEPARTMENT_OTHER): Payer: Self-pay

## 2021-02-17 MED ORDER — PFIZER COVID-19 VAC BIVALENT 30 MCG/0.3ML IM SUSP
INTRAMUSCULAR | 0 refills | Status: DC
  Filled 2021-02-17: qty 0.3, 1d supply, fill #0

## 2021-02-21 DIAGNOSIS — M545 Low back pain, unspecified: Secondary | ICD-10-CM | POA: Diagnosis not present

## 2021-03-08 ENCOUNTER — Other Ambulatory Visit (HOSPITAL_COMMUNITY): Payer: Self-pay

## 2021-03-08 DIAGNOSIS — M545 Low back pain, unspecified: Secondary | ICD-10-CM | POA: Diagnosis not present

## 2021-03-15 DIAGNOSIS — M545 Low back pain, unspecified: Secondary | ICD-10-CM | POA: Diagnosis not present

## 2021-03-22 DIAGNOSIS — M545 Low back pain, unspecified: Secondary | ICD-10-CM | POA: Diagnosis not present

## 2021-03-27 DIAGNOSIS — M545 Low back pain, unspecified: Secondary | ICD-10-CM | POA: Diagnosis not present

## 2021-04-05 DIAGNOSIS — M545 Low back pain, unspecified: Secondary | ICD-10-CM | POA: Diagnosis not present

## 2021-04-21 DIAGNOSIS — M545 Low back pain, unspecified: Secondary | ICD-10-CM | POA: Diagnosis not present

## 2021-05-02 DIAGNOSIS — M545 Low back pain, unspecified: Secondary | ICD-10-CM | POA: Diagnosis not present

## 2021-05-15 DIAGNOSIS — M545 Low back pain, unspecified: Secondary | ICD-10-CM | POA: Diagnosis not present

## 2021-05-30 DIAGNOSIS — L650 Telogen effluvium: Secondary | ICD-10-CM | POA: Diagnosis not present

## 2021-05-30 DIAGNOSIS — B070 Plantar wart: Secondary | ICD-10-CM | POA: Diagnosis not present

## 2021-05-30 DIAGNOSIS — L538 Other specified erythematous conditions: Secondary | ICD-10-CM | POA: Diagnosis not present

## 2021-05-30 DIAGNOSIS — L814 Other melanin hyperpigmentation: Secondary | ICD-10-CM | POA: Diagnosis not present

## 2021-05-30 DIAGNOSIS — L659 Nonscarring hair loss, unspecified: Secondary | ICD-10-CM | POA: Diagnosis not present

## 2021-05-30 DIAGNOSIS — D225 Melanocytic nevi of trunk: Secondary | ICD-10-CM | POA: Diagnosis not present

## 2021-05-30 DIAGNOSIS — L821 Other seborrheic keratosis: Secondary | ICD-10-CM | POA: Diagnosis not present

## 2021-06-01 ENCOUNTER — Other Ambulatory Visit (HOSPITAL_COMMUNITY): Payer: Self-pay

## 2021-06-01 MED ORDER — NORETHIN ACE-ETH ESTRAD-FE 1-20 MG-MCG(24) PO TABS
ORAL_TABLET | ORAL | 3 refills | Status: DC
  Filled 2021-06-01: qty 84, 84d supply, fill #0
  Filled 2021-08-22: qty 84, 84d supply, fill #1
  Filled 2021-11-16: qty 84, 84d supply, fill #2

## 2021-06-14 DIAGNOSIS — M545 Low back pain, unspecified: Secondary | ICD-10-CM | POA: Diagnosis not present

## 2021-07-04 DIAGNOSIS — M545 Low back pain, unspecified: Secondary | ICD-10-CM | POA: Diagnosis not present

## 2021-07-12 DIAGNOSIS — L659 Nonscarring hair loss, unspecified: Secondary | ICD-10-CM | POA: Diagnosis not present

## 2021-07-12 DIAGNOSIS — R238 Other skin changes: Secondary | ICD-10-CM | POA: Diagnosis not present

## 2021-07-12 DIAGNOSIS — B070 Plantar wart: Secondary | ICD-10-CM | POA: Diagnosis not present

## 2021-07-12 DIAGNOSIS — L538 Other specified erythematous conditions: Secondary | ICD-10-CM | POA: Diagnosis not present

## 2021-07-26 ENCOUNTER — Other Ambulatory Visit (HOSPITAL_COMMUNITY): Payer: Self-pay

## 2021-08-22 ENCOUNTER — Encounter: Payer: Self-pay | Admitting: Family Medicine

## 2021-08-22 ENCOUNTER — Other Ambulatory Visit (HOSPITAL_COMMUNITY): Payer: Self-pay

## 2021-08-22 ENCOUNTER — Ambulatory Visit: Payer: 59 | Admitting: Family Medicine

## 2021-08-22 VITALS — BP 103/69 | HR 94 | Ht 62.0 in | Wt 112.2 lb

## 2021-08-22 DIAGNOSIS — Z114 Encounter for screening for human immunodeficiency virus [HIV]: Secondary | ICD-10-CM

## 2021-08-22 DIAGNOSIS — Z7689 Persons encountering health services in other specified circumstances: Secondary | ICD-10-CM

## 2021-08-22 DIAGNOSIS — Z0000 Encounter for general adult medical examination without abnormal findings: Secondary | ICD-10-CM | POA: Diagnosis not present

## 2021-08-22 DIAGNOSIS — Z1159 Encounter for screening for other viral diseases: Secondary | ICD-10-CM | POA: Diagnosis not present

## 2021-08-22 LAB — LIPID PANEL
Cholesterol: 133 mg/dL (ref 0–200)
HDL: 51.5 mg/dL (ref >39.00)
LDL Cholesterol: 56 mg/dL (ref 0–99)
NonHDL: 81.87
Total CHOL/HDL Ratio: 3
Triglycerides: 128 mg/dL (ref 0.0–149.0)
VLDL: 25.6 mg/dL (ref 0.0–40.0)

## 2021-08-22 LAB — CBC
HCT: 40.4 % (ref 36.0–46.0)
Hemoglobin: 13.7 g/dL (ref 12.0–15.0)
MCHC: 33.8 g/dL (ref 30.0–36.0)
MCV: 90.2 fl (ref 78.0–100.0)
Platelets: 326 10*3/uL (ref 150.0–400.0)
RBC: 4.47 Mil/uL (ref 3.87–5.11)
RDW: 12.2 % (ref 11.5–15.5)
WBC: 7.5 10*3/uL (ref 4.0–10.5)

## 2021-08-22 LAB — COMPREHENSIVE METABOLIC PANEL
ALT: 20 U/L (ref 0–35)
AST: 17 U/L (ref 0–37)
Albumin: 4.2 g/dL (ref 3.5–5.2)
Alkaline Phosphatase: 64 U/L (ref 39–117)
BUN: 15 mg/dL (ref 6–23)
CO2: 27 mEq/L (ref 19–32)
Calcium: 9 mg/dL (ref 8.4–10.5)
Chloride: 103 mEq/L (ref 96–112)
Creatinine, Ser: 0.75 mg/dL (ref 0.40–1.20)
GFR: 106.22 mL/min (ref >60.00)
Glucose, Bld: 78 mg/dL (ref 70–99)
Potassium: 4.1 mEq/L (ref 3.5–5.1)
Sodium: 138 mEq/L (ref 135–145)
Total Bilirubin: 0.3 mg/dL (ref 0.2–1.2)
Total Protein: 7.1 g/dL (ref 6.0–8.3)

## 2021-08-22 LAB — TSH: TSH: 1.72 u[IU]/mL (ref 0.35–5.50)

## 2021-08-22 NOTE — Patient Instructions (Signed)
Thank you for choosing [facility] at [facility] for your Primary Care needs. I am excited for the opportunity to partner with you to meet your health care goals. It was a pleasure meeting you today! ? ? ? ?Information on diet, exercise, and health maintenance recommendations are listed below. This is information to help you be sure you are on track for optimal health and monitoring.  ? ?Please look over this and let us know if you have any questions or if you have completed any of the health maintenance outside of [place] so that we can be sure your records are up to date.  ?___________________________________________________________ ? ?MyChart:  ?For all urgent or time sensitive needs we ask that you please call the office to avoid delays. Our number is [phone]. ?MyChart is not constantly monitored and due to the large volume of messages a day, replies may take up to 72 business hours. ? ?MyChart Policy: ?MyChart allows for you to see your visit notes, after visit summary, provider recommendations, lab and tests results, make an appointment, request refills, and contact your provider or the office for non-urgent questions or concerns. Providers are seeing patients during normal business hours and do not have built in time to review MyChart messages.  ?We ask that you allow a minimum of 3 business days for responses to [employer]. For this reason, please do not send urgent requests through [place]. Please call the office at [phone]. ?New and ongoing conditions may require a visit. We have virtual and in-person visits available for your convenience.  ?Complex MyChart concerns may require a visit. Your provider may request you schedule a virtual or in-person visit to ensure we are providing the best care possible. ?MyChart messages sent after 11:00 AM on Friday will not be received by the provider until Monday morning.  ?  ?Lab and Test Results: ?You will receive your lab and  test results on MyChart as soon as they are completed and results have been sent by the lab or testing facility. Due to this service, you will receive your results BEFORE your provider.  ?I review lab and test results each morning prior to seeing patients. Some results require collaboration with other providers to ensure you are receiving the most appropriate care. For this reason, we ask that you please allow a minimum of 3-5 business days from the time that ALL results have been received for your provider to receive and review lab and test results and contact you about these.  ?Most lab and test result comments from the provider will be sent through [place]. Your provider may recommend changes to the plan of care, follow-up visits, repeat testing, ask questions, or request an office visit to discuss these results. You may reply directly to this message or call the office to provide information for the provider or set up an appointment. ?In some instances, you will be called with test results and recommendations. Please let us know if this is preferred and we will make note of this in your chart to provide this for you.    ?If you have not heard a response to your lab or test results in 5 business days from all results returning to [place], please call the office to let us know. We ask that you please avoid calling prior to this time unless there is an emergent concern. Due to high call volumes, this can delay the resulting process. ? ?After Hours: ?For all non-emergency after hours  needs, please call the office at [phone] and select the option to reach the on-call  service. On-call services are shared between multiple [facility] offices and therefore it will not be possible to speak directly with your provider. On-call providers may provide medical advice and recommendations, but are unable to provide refills for maintenance medications.  ?For all emergency or urgent medical needs after normal business  hours, we recommend that you seek care at the closest Urgent Care or Emergency Department to ensure appropriate treatment in a timely manner.  ?[facility] has a 24 hour emergency room located on the ground floor for your convenience.  ? ?Urgent Concerns During the Business Day ?Providers are seeing patients from 8AM to [place] with a busy schedule and are most often not able to respond to non-urgent calls until the end of the day or the next business day. ?If you should have URGENT concerns during the day, please call and speak to the nurse or schedule a same day appointment so that we can address your concern without delay.  ? ?Thank you, again, for choosing me as your health care partner. I appreciate your trust and look forward to learning more about you.  ? ?[employer]. Olevia Bowens, DNP, FNP-C ? ?___________________________________________________________ ? ?Health Maintenance Recommendations ?Screening Testing ?Mammogram ?Every 1-2 years based on history and risk factors ?Starting at age 97 ?Pap Smear ?Ages 21-39 every 3 years ?Ages 35-65 every 5 years with HPV testing ?More frequent testing may be required based on results and history ?Colon Cancer Screening ?Every 1-10 years based on test performed, risk factors, and history ?Starting at age 22 ?Bone Density Screening ?Every 2-10 years based on history ?Starting at age 10 for women ?Recommendations for men differ based on medication usage, history, and risk factors ?AAA Screening ?One time ultrasound ?Men 22-69 years old who have ever smoked ?Lung Cancer Screening ?Low Dose Lung CT every 12 months ?Age 33-80 years with a 20 pack-year smoking history who still smoke or who have quit within the last 15 years ? ?Screening Labs ?Routine  Labs: Complete Blood Count (CBC), Complete Metabolic Panel (CMP), Cholesterol (Lipid Panel) ?Every 6-12 months based on history and medications ?May be recommended more frequently based on current conditions or  previous results ?Hemoglobin A1c Lab ?Every 3-12 months based on history and previous results ?Starting at age 63 or earlier with diagnosis of diabetes, high cholesterol, BMI >26, and/or risk factors ?Frequent monitoring for patients with diabetes to ensure blood sugar control ?Thyroid Panel (TSH w/ T3 & T4) ?Every 6 months based on history, symptoms, and risk factors ?May be repeated more often if on medication ?HIV ?One time testing for all patients 3 and older ?May be repeated more frequently for patients with increased risk factors or exposure ?Hepatitis C ?One time testing for all patients 28 and older ?May be repeated more frequently for patients with increased risk factors or exposure ?Gonorrhea, Chlamydia ?Every 12 months for all sexually active persons 13-24 years ?Additional monitoring may be recommended for those who are considered high risk or who have symptoms ?PSA ?Men 34-59 years old with risk factors ?Additional screening may be recommended from age 75-69 based on risk factors, symptoms, and history ? ?Vaccine Recommendations ?Tetanus Booster ?All adults every 10 years ?Flu Vaccine ?All patients 6 months and older every year ?COVID Vaccine ?All patients 12 years and older ?Initial dosing with booster ?May recommend additional booster based on age and health history ?HPV Vaccine ?2 doses all  patients age 12-26 ?Dosing may be considered for patients over 26 ?Shingles Vaccine (Shingrix) ?2 doses all adults 50 years and older ?Pneumonia (Pneumovax 23) ?All adults 59 years and older ?May recommend earlier dosing based on health history ?Pneumonia (Prevnar 38) ?All adults 49 years and older ?Dosed 1 year after Pneumovax 23 ?Pneumonia (Prevnar 50) ?All adults 34 years and older (adults 38-93 with certain conditions or risk factors) ?1 dose  ?For those who have no received Prevnar 13 vaccine previously ? ? ?Additional Screening, Testing, and Vaccinations may be recommended on an individualized basis based on  family history, health history, risk factors, and/or exposure.  ?__________________________________________________________ ? ?Diet Recommendations for All Patients ? ?I recommend that all patients maintain a di

## 2021-08-22 NOTE — Progress Notes (Signed)
? ?Complete physical exam ? ?Patient: Sandra Hart   DOB: Mar 25, 1991   31 y.o. Female  MRN: [id number] ? ?Subjective:  ?  ?CC: establish care, no chronic conditions/medication, no concerns  ? ? ?Sandra Hart is a 31 y.o. female who presents today for a complete physical exam. She reports consuming a general diet. Home exercise routine includes yoga, group classes at the [facility], elliptical, bicycling. She generally feels well. She reports sleeping well. She does not have additional problems to discuss today.  ? ?She works as a Press photographer at [employer].  ? ?Reports she does have occasional migraines (typically once a month or less) that tend to be more common in the summer. She gets good relief with Advil or Excedrin. No new concerns.  ? ?Last pap was normal 2022 ([facility])- requesting records. ? ? ?Most recent fall risk assessment: ? ?  08/22/2021  ?  1:51 PM  ?Fall Risk   ?Falls in the past year? 0  ?Number falls in past yr: 0  ?Injury with Fall? 0  ? ?  ?Most recent depression screenings: ? ?  08/22/2021  ?  1:51 PM 11/11/2019  ? 10:00 AM  ?PHQ 2/9 Scores  ?PHQ - 2 Score 0 0  ? ? ?Vision:Within last year, Dental: No current dental problems and Receives regular dental care, and STD: no concerns or history  ? ?Patient Active Problem List  ? Diagnosis Date Noted  ? Nutritional counseling 11/27/2019  ? Migraine without aura and without status migrainosus, not intractable 03/24/2015  ? ?No past medical history on file. ?Family History  ?Problem Relation Age of Onset  ? Hypertension Mother   ? Hypertension Father   ? Healthy Sister   ? ?Allergies  ?Allergen Reactions  ? Sulfa Antibiotics Itching and Rash  ? ?  ? ?Patient Care Team: ?Terrilyn Saver, NP as PCP - General (Family Medicine)  ? ?Outpatient Medications Prior to Visit  ?Medication Sig  ? Biotin 1 MG CAPS biotin  ? Multiple Vitamin (MULTIVITAMIN) tablet Take 1 tablet by mouth daily.  ? naproxen sodium (ANAPROX) 550 MG tablet TAKE 1 TABLET BY MOUTH TWO  TIMES DAILY AS NEEDED  ? Norethin Ace-Eth Estrad-FE (BLISOVI 24 FE PO) Take 1 mg by mouth every morning.  ? [DISCONTINUED] COVID-19 mRNA bivalent vaccine, Pfizer, (PFIZER COVID-19 VAC BIVALENT) injection Inject into the muscle.  ? [DISCONTINUED] gabapentin (NEURONTIN) 300 MG capsule Take 1 capsule (300 mg total) by mouth 3 (three) times daily.  ? [DISCONTINUED] influenza vac split quadrivalent PF (FLUARIX) 0.5 ML injection Inject into the muscle.  ? [DISCONTINUED] NAPROXEN PO Take by mouth daily as needed.   ? [DISCONTINUED] naproxen sodium (ANAPROX) 550 MG tablet Take 1 tablet by mouth twice a day as needed  ? [DISCONTINUED] Norethindrone Acetate-Ethinyl Estrad-FE (BLISOVI 24 FE) 1-20 MG-MCG(24) tablet Take 1 tablet by mouth once daily  ? [DISCONTINUED] Norethindrone Acetate-Ethinyl Estrad-FE (LOESTRIN 24 FE) 1-20 MG-MCG(24) tablet TAKE 1 TABLET BY MOUTH DAILY  ? [DISCONTINUED] Norethindrone Acetate-Ethinyl Estrad-FE (LOESTRIN 24 FE) 1-20 MG-MCG(24) tablet Take 1 tablet by mouth once daily  ? ?No facility-administered medications prior to visit.  ? ? ?ROS ?All review of systems negative except what is listed in the HPI ? ? ? ? ?   ?Objective:  ? ?  ?BP 103/69   Pulse 94   Ht '5\' 2"'$  (1.575 m)   Wt 112 lb 3.2 oz (50.9 kg)   LMP 07/28/2021   BMI 20.52 kg/m?  ? ? ?  Physical Exam ?Vitals reviewed.  ?Constitutional:   ?   General: She is not in acute distress. ?   Appearance: Normal appearance. She is normal weight. She is not ill-appearing.  ?HENT:  ?   Head: Normocephalic and atraumatic.  ?   Right Ear: Tympanic membrane normal.  ?   Left Ear: Tympanic membrane normal.  ?   Nose: Nose normal.  ?   Mouth/Throat:  ?   Mouth: Mucous membranes are moist.  ?   Pharynx: Oropharynx is clear.  ?Eyes:  ?   Extraocular Movements: Extraocular movements intact.  ?   Conjunctiva/sclera: Conjunctivae normal.  ?   Pupils: Pupils are equal, round, and reactive to light.  ?Cardiovascular:  ?   Rate and Rhythm: Normal rate and  regular rhythm.  ?   Pulses: Normal pulses.  ?   Heart sounds: Normal heart sounds.  ?Pulmonary:  ?   Effort: Pulmonary effort is normal.  ?   Breath sounds: Normal breath sounds.  ?Abdominal:  ?   General: Abdomen is flat. Bowel sounds are normal. There is no distension.  ?   Palpations: Abdomen is soft. There is no mass.  ?   Tenderness: There is no abdominal tenderness. There is no right CVA tenderness, left CVA tenderness, guarding or rebound.  ?Musculoskeletal:     ?   General: Normal range of motion.  ?   Cervical back: Normal range of motion and neck supple.  ?Skin: ?   General: Skin is warm and dry.  ?   Capillary Refill: Capillary refill takes less than 2 seconds.  ?Neurological:  ?   General: No focal deficit present.  ?   Mental Status: She is alert and oriented to person, place, and time. Mental status is at baseline.  ?Psychiatric:     ?   Mood and Affect: Mood normal.     ?   Behavior: Behavior normal.     ?   Thought Content: Thought content normal.     ?   Judgment: Judgment normal.  ?  ? ? ? ? ? ? ?No results found for any visits on 08/22/21. ? ?   ?Assessment & Plan:  ?  ?Routine Health Maintenance and Physical Exam ? ?Immunization History  ?Administered Date(s) Administered  ? Influenza,inj,Quad PF,6+ Mos 01/27/2021  ? Influenza-Unspecified 01/15/2015  ? PFIZER(Purple Top)SARS-COV-2 Vaccination 04/15/2019, 05/08/2019  ? PPD Test 11/09/2011, 11/20/2011  ? Pension scheme manager 20yr & up 01/27/2021  ? Tdap 04/16/2008, 11/11/2019  ? ? ?Health Maintenance  ?Topic Date Due  ? Hepatitis C Screening  Never done  ? PAP SMEAR-Modifier  Never done  ? INFLUENZA VACCINE  11/14/2021  ? TETANUS/TDAP  11/10/2029  ? COVID-19 Vaccine  Completed  ? HIV Screening  Completed  ? HPV VACCINES  Aged Out  ? ? ?Discussed health benefits of physical activity, and encouraged her to engage in regular exercise appropriate for her age and condition. ? ?Problem List Items Addressed This Visit   ?None ?Visit  Diagnoses   ? ? Annual physical exam    -  Primary  ? Relevant Orders  ? CBC  ? Comprehensive metabolic panel  ? Lipid panel  ? TSH  ? Hepatitis C antibody  ? HIV Antibody (routine testing w rflx)  ? Encounter to establish care      ? Encounter for screening for HIV      ? Relevant Orders  ? HIV Antibody (routine testing w rflx)  ?  Encounter for hepatitis C screening test for low risk patient      ? Relevant Orders  ? Hepatitis C antibody  ? ?  ? ?Return in about 1 year (around 08/23/2022) for physical . ? ?  ? ?Terrilyn Saver, NP ? ? ?

## 2021-08-23 ENCOUNTER — Other Ambulatory Visit (HOSPITAL_COMMUNITY): Payer: Self-pay

## 2021-08-23 LAB — HEPATITIS C ANTIBODY
Hepatitis C Ab: NONREACTIVE
SIGNAL TO CUT-OFF: 0.16 (ref <1.00)

## 2021-08-23 LAB — HIV ANTIBODY (ROUTINE TESTING W REFLEX): HIV 1&2 Ab, 4th Generation: NONREACTIVE

## 2021-08-25 ENCOUNTER — Encounter: Payer: Self-pay | Admitting: *Deleted

## 2021-11-16 ENCOUNTER — Other Ambulatory Visit (HOSPITAL_COMMUNITY): Payer: Self-pay

## 2021-12-29 ENCOUNTER — Ambulatory Visit: Payer: 59 | Admitting: Family

## 2021-12-29 ENCOUNTER — Other Ambulatory Visit (HOSPITAL_BASED_OUTPATIENT_CLINIC_OR_DEPARTMENT_OTHER): Payer: Self-pay

## 2021-12-29 VITALS — BP 116/69 | HR 107 | Temp 98.3°F | Resp 16 | Wt 112.0 lb

## 2021-12-29 DIAGNOSIS — U071 COVID-19: Secondary | ICD-10-CM

## 2021-12-29 LAB — POC COVID19 BINAXNOW: SARS Coronavirus 2 Ag: POSITIVE — AB

## 2021-12-29 MED ORDER — MOLNUPIRAVIR EUA 200MG CAPSULE
4.0000 | ORAL_CAPSULE | Freq: Two times a day (BID) | ORAL | 0 refills | Status: AC
  Filled 2021-12-29: qty 40, 5d supply, fill #0

## 2021-12-29 NOTE — Progress Notes (Signed)
Sandra Hart is a 31 y.o. female with the following history as recorded in [employer]:  Patient Active Problem List   Diagnosis Date Noted   Nutritional counseling 11/27/2019   Migraine without aura and without status migrainosus, not intractable 03/24/2015    Current Outpatient Medications  Medication Sig Dispense Refill   molnupiravir EUA (LAGEVRIO) 200 mg CAPS capsule Take 4 capsules (800 mg total) by mouth 2 (two) times daily for 5 days. 40 capsule 0   Biotin 1 MG CAPS biotin     Multiple Vitamin (MULTIVITAMIN) tablet Take 1 tablet by mouth daily.     naproxen sodium (ANAPROX) 550 MG tablet TAKE 1 TABLET BY MOUTH TWO TIMES DAILY AS NEEDED 30 tablet 3   Norethin Ace-Eth Estrad-FE (BLISOVI 24 FE PO) Take 1 mg by mouth every morning.     Norethindrone Acetate-Ethinyl Estrad-FE (LOESTRIN 24 FE) 1-20 MG-MCG(24) tablet Take 1 tablet by mouth once daily 84 tablet 3   No current facility-administered medications for this visit.    Allergies: Sulfa antibiotics  Past Medical History:  Diagnosis Date   Allergy    Heart murmur     Past Surgical History:  Procedure Laterality Date   NO PAST SURGERIES      Family History  Problem Relation Age of Onset   Hypertension Mother    Hypertension Father    Healthy Sister    Diabetes Maternal Grandfather    Cancer Maternal Grandfather    COPD Paternal Grandfather    Cancer Paternal Grandfather     Social History   Tobacco Use   Smoking status: Never   Smokeless tobacco: Never  Substance Use Topics   Alcohol use: Yes    Alcohol/week: 2.0 standard drinks of alcohol    Types: 1 Glasses of wine, 1 Cans of beer per week    Subjective:   Started Wednesday with nasal congestion/ sinus pressure; notes that she lives with her parents and her father tested positive for COVID yesterday; she tested negative for COVID yesterday but was concerned that her symptoms have persisted; 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     Objective:  Vitals:   12/29/21 1354  BP: 116/69  Pulse: (!) 107  Resp: 16  Temp: 98.3 F (36.8 C)  TempSrc: Oral  SpO2: 100%  Weight: 112 lb (50.8 kg)    General: Well developed, well nourished, in no acute distress  Skin : Warm and dry.  Head: Normocephalic and atraumatic  Eyes: Sclera and conjunctiva clear; pupils round and reactive to light; extraocular movements intact  Ears: External normal; canals clear; tympanic membranes normal  Oropharynx: Pink, supple. No suspicious lesions  Neck: Supple without thyromegaly, adenopathy  Lungs: Respirations unlabored;  Neurologic: Alert and oriented; speech intact; face symmetrical; moves all extremities well; CNII-XII intact without focal deficit   Assessment:  1. COVID-19     Plan:  Repeat testing in office is positive; symptomatic treatment discussed; Rx for Molnupiravir- take 4 tablets po bid x 5 days; she will contact [facility] [employer] for guidance on work restrictions; increase fluids, rest and follow up worse, no better.   No follow-ups on file.  Orders Placed This Encounter  Procedures   POC COVID-19    Order Specific Question:   Previously tested for COVID-19    Answer:   No    Order Specific Question:   Resident in a congregate (group) care setting    Answer:   No  Order Specific Question:   Employed in healthcare setting    Answer:   No    Order Specific Question:   Pregnant    Answer:   No    Requested Prescriptions   Signed Prescriptions Disp Refills   molnupiravir EUA (LAGEVRIO) 200 mg CAPS capsule 40 capsule 0    Sig: Take 4 capsules (800 mg total) by mouth 2 (two) times daily for 5 days.

## 2022-01-02 ENCOUNTER — Encounter: Payer: Self-pay | Admitting: Family

## 2022-01-02 ENCOUNTER — Other Ambulatory Visit: Payer: Self-pay | Admitting: Family

## 2022-01-02 ENCOUNTER — Other Ambulatory Visit (HOSPITAL_COMMUNITY): Payer: Self-pay

## 2022-01-02 MED ORDER — DOXYCYCLINE HYCLATE 100 MG PO TABS
100.0000 mg | ORAL_TABLET | Freq: Two times a day (BID) | ORAL | 0 refills | Status: DC
  Filled 2022-01-02: qty 14, 7d supply, fill #0

## 2022-01-02 NOTE — Telephone Encounter (Signed)
Patient calling to check up on this

## 2022-01-17 DIAGNOSIS — H5213 Myopia, bilateral: Secondary | ICD-10-CM | POA: Diagnosis not present

## 2022-02-06 ENCOUNTER — Other Ambulatory Visit (HOSPITAL_COMMUNITY): Payer: Self-pay

## 2022-02-06 DIAGNOSIS — N946 Dysmenorrhea, unspecified: Secondary | ICD-10-CM | POA: Diagnosis not present

## 2022-02-06 DIAGNOSIS — Z1151 Encounter for screening for human papillomavirus (HPV): Secondary | ICD-10-CM | POA: Diagnosis not present

## 2022-02-06 DIAGNOSIS — Z6820 Body mass index (BMI) 20.0-20.9, adult: Secondary | ICD-10-CM | POA: Diagnosis not present

## 2022-02-06 DIAGNOSIS — Z124 Encounter for screening for malignant neoplasm of cervix: Secondary | ICD-10-CM | POA: Diagnosis not present

## 2022-02-06 DIAGNOSIS — Z01419 Encounter for gynecological examination (general) (routine) without abnormal findings: Secondary | ICD-10-CM | POA: Diagnosis not present

## 2022-02-06 MED ORDER — TARINA 24 FE 1-20 MG-MCG(24) PO TABS
1.0000 | ORAL_TABLET | Freq: Every day | ORAL | 4 refills | Status: DC
  Filled 2022-02-06: qty 84, 84d supply, fill #0
  Filled 2022-05-03: qty 84, 84d supply, fill #1
  Filled 2022-07-23 (×2): qty 84, 84d supply, fill #2
  Filled 2022-10-13: qty 84, 84d supply, fill #3
  Filled 2023-01-09: qty 84, 84d supply, fill #4

## 2022-02-07 ENCOUNTER — Other Ambulatory Visit (HOSPITAL_COMMUNITY): Payer: Self-pay

## 2022-05-03 ENCOUNTER — Other Ambulatory Visit (HOSPITAL_COMMUNITY): Payer: Self-pay

## 2022-05-31 DIAGNOSIS — L821 Other seborrheic keratosis: Secondary | ICD-10-CM | POA: Diagnosis not present

## 2022-05-31 DIAGNOSIS — D2271 Melanocytic nevi of right lower limb, including hip: Secondary | ICD-10-CM | POA: Diagnosis not present

## 2022-05-31 DIAGNOSIS — L814 Other melanin hyperpigmentation: Secondary | ICD-10-CM | POA: Diagnosis not present

## 2022-05-31 DIAGNOSIS — L858 Other specified epidermal thickening: Secondary | ICD-10-CM | POA: Diagnosis not present

## 2022-05-31 DIAGNOSIS — D225 Melanocytic nevi of trunk: Secondary | ICD-10-CM | POA: Diagnosis not present

## 2022-07-23 ENCOUNTER — Other Ambulatory Visit (HOSPITAL_COMMUNITY): Payer: Self-pay

## 2022-07-23 ENCOUNTER — Other Ambulatory Visit: Payer: Self-pay

## 2022-08-27 ENCOUNTER — Ambulatory Visit (INDEPENDENT_AMBULATORY_CARE_PROVIDER_SITE_OTHER): Payer: 59 | Admitting: Family Medicine

## 2022-08-27 ENCOUNTER — Encounter: Payer: Self-pay | Admitting: Family Medicine

## 2022-08-27 VITALS — BP 96/64 | HR 88 | Ht 62.0 in | Wt 112.0 lb

## 2022-08-27 DIAGNOSIS — Z0000 Encounter for general adult medical examination without abnormal findings: Secondary | ICD-10-CM

## 2022-08-27 DIAGNOSIS — Z131 Encounter for screening for diabetes mellitus: Secondary | ICD-10-CM

## 2022-08-27 DIAGNOSIS — Z13220 Encounter for screening for lipoid disorders: Secondary | ICD-10-CM

## 2022-08-27 DIAGNOSIS — Z1329 Encounter for screening for other suspected endocrine disorder: Secondary | ICD-10-CM

## 2022-08-27 NOTE — Progress Notes (Signed)
Complete physical exam  Patient: Sandra Hart   DOB: [birth year]   32 y.o. Female  MRN: [id number]  Subjective:    Chief Complaint  Patient presents with   Annual Exam    Sandra Hart is a 32 y.o. female who presents today for a complete physical exam. She reports consuming a general diet. Home exercise routine includes walking. She generally feels well. She reports sleeping well. She does not have additional problems to discuss today.   Currently lives with: parents Acute concerns or interim problems since last visit: no  Still work in the [employer]  Vision concerns: no concerns, sees ophthalmology regularly  Dental concerns: no concerns STD concerns: none  Patient endorses ETOH use. - social only  Patient denies nicotine use. Patient denies illegal substance use.    Females:  She is not currently  sexually active  She denies  concerns today about STI Contraception choices are: OCPs LMP: 08/24/22      Most recent fall risk assessment:    08/27/2022    1:14 PM  Fall Risk   Falls in the past year? 0  Number falls in past yr: 0  Injury with Fall? 0  Risk for fall due to : No Fall Risks  Follow up Falls evaluation completed     Most recent depression screenings:    08/27/2022    1:33 PM 08/22/2021    1:51 PM  PHQ 2/9 Scores  PHQ - 2 Score 0 0  PHQ- 9 Score 2           Patient Active Problem List   Diagnosis Date Noted   Nutritional counseling 11/27/2019   Migraine without aura and without status migrainosus, not intractable 03/24/2015   Past Medical History:  Diagnosis Date   Allergy    Heart murmur    Allergies  Allergen Reactions   Sulfa Antibiotics Itching and Rash      Patient Care Team: Clayborne Dana, NP as PCP - General (Family Medicine)   Outpatient Medications Prior to Visit  Medication Sig   Biotin 1 MG CAPS biotin   fluticasone (FLONASE) 50 MCG/ACT nasal spray Place into both nostrils daily.   levocetirizine  (XYZAL) 5 MG tablet Take 5 mg by mouth every evening.   Multiple Vitamin (MULTIVITAMIN) tablet Take 1 tablet by mouth daily.   naproxen sodium (ANAPROX) 550 MG tablet TAKE 1 TABLET BY MOUTH TWO TIMES DAILY AS NEEDED   Norethin Ace-Eth Estrad-FE (BLISOVI 24 FE PO) Take 1 mg by mouth every morning.   Norethindrone Acetate-Ethinyl Estrad-FE (TARINA 24 FE) 1-20 MG-MCG(24) tablet Take 1 tablet by mouth daily.   Probiotic Product (PROBIOTIC BLEND PO) Take by mouth daily.   [DISCONTINUED] doxycycline (VIBRA-TABS) 100 MG tablet Take 1 tablet (100 mg total) by mouth 2 (two) times daily.   No facility-administered medications prior to visit.    ROS All review of systems negative except what is listed in the HPI         Objective:     BP 96/64   Pulse 88   Ht 5\' 2"  (1.575 m)   Wt 112 lb (50.8 kg)   SpO2 100%   BMI 20.49 kg/m    Physical Exam Vitals reviewed.  Constitutional:      General: She is not in acute distress.    Appearance: Normal appearance. She is not ill-appearing.  HENT:     Head: Normocephalic and atraumatic.     Right Ear: Tympanic membrane  normal.     Left Ear: Tympanic membrane normal. There is impacted cerumen.     Nose: Nose normal.     Mouth/Throat:     Mouth: Mucous membranes are moist.     Pharynx: Oropharynx is clear.  Eyes:     Extraocular Movements: Extraocular movements intact.     Conjunctiva/sclera: Conjunctivae normal.     Pupils: Pupils are equal, round, and reactive to light.  Neck:     Vascular: No carotid bruit.  Cardiovascular:     Rate and Rhythm: Normal rate and regular rhythm.     Pulses: Normal pulses.     Heart sounds: Normal heart sounds.  Pulmonary:     Effort: Pulmonary effort is normal.     Breath sounds: Normal breath sounds.  Abdominal:     General: Abdomen is flat. Bowel sounds are normal. There is no distension.     Palpations: Abdomen is soft. There is no mass.     Tenderness: There is no abdominal tenderness. There is  no right CVA tenderness, left CVA tenderness, guarding or rebound.  Genitourinary:    Comments: Deferred exam Musculoskeletal:        General: Normal range of motion.     Cervical back: Normal range of motion and neck supple. No tenderness.     Right lower leg: No edema.     Left lower leg: No edema.  Lymphadenopathy:     Cervical: No cervical adenopathy.  Skin:    General: Skin is warm and dry.     Capillary Refill: Capillary refill takes less than 2 seconds.  Neurological:     General: No focal deficit present.     Mental Status: She is alert and oriented to person, place, and time. Mental status is at baseline.  Psychiatric:        Mood and Affect: Mood normal.        Behavior: Behavior normal.        Thought Content: Thought content normal.        Judgment: Judgment normal.      No results found for any visits on 08/27/22.     Assessment & Plan:    Routine Health Maintenance and Physical Exam Discussed health promotion and safety including diet and exercise recommendations, dental health, and injury prevention. Tobacco cessation if applicable. Seat belts, sunscreen, smoke detectors, etc.    Immunization History  Administered Date(s) Administered   DTaP 07/24/1990, 09/25/1990, 11/27/1990, 09/15/1991, 09/24/1995   HIB (PRP-OMP) 07/24/1990, 09/25/1990, 11/27/1990, 09/15/1991   Hepatitis B, PED/ADOLESCENT 09/24/1995, 10/25/1995, 02/26/1996   IPV 07/24/1990, 09/25/1990, 09/15/1991, 09/24/1995   Influenza,inj,Quad PF,6+ Mos 01/27/2021   Influenza-Unspecified 01/15/2015, 01/28/2022   MMR 09/15/1991, 09/24/1995   PFIZER(Purple Top)SARS-COV-2 Vaccination 04/15/2019, 05/04/2019, 03/15/2020   PPD Test 11/09/2011, 11/20/2011   Pfizer Covid-19 Vaccine Bivalent Booster 17yrs & up 01/27/2021   Tdap 04/16/2008, 11/11/2019   Varicella 03/14/1994    Health Maintenance  Topic Date Due   COVID-19 Vaccine (5 - 2023-24 season) 08/27/2023 (Originally 12/15/2021)   INFLUENZA VACCINE   11/15/2022   PAP SMEAR-Modifier  02/04/2024   DTaP/Tdap/Td (8 - Td or Tdap) 11/10/2029   Hepatitis C Screening  Completed   HIV Screening  Completed   HPV VACCINES  Aged Out        Problem List Items Addressed This Visit   None Visit Diagnoses     Annual physical exam    -  Primary Doing well. No new concerns. Non-bothersome left ear cerumen impaction -  discussed supportive measures. Follow-up as needed.     Relevant Orders   CBC with Differential/Platelet   Comprehensive metabolic panel   Diabetes mellitus screening       Relevant Orders   Comprehensive metabolic panel            Return in about 1 year (around 08/27/2023) for physical.     Clayborne Dana, NP

## 2022-08-28 LAB — CBC WITH DIFFERENTIAL/PLATELET
Basophils Absolute: 0.1 10*3/uL (ref 0.0–0.1)
Basophils Relative: 0.7 % (ref 0.0–3.0)
Eosinophils Absolute: 0.1 10*3/uL (ref 0.0–0.7)
Eosinophils Relative: 1.4 % (ref 0.0–5.0)
HCT: 41.2 % (ref 36.0–46.0)
Hemoglobin: 14.3 g/dL (ref 12.0–15.0)
Lymphocytes Relative: 35.3 % (ref 12.0–46.0)
Lymphs Abs: 2.8 10*3/uL (ref 0.7–4.0)
MCHC: 34.8 g/dL (ref 30.0–36.0)
MCV: 90.4 fl (ref 78.0–100.0)
Monocytes Absolute: 0.4 10*3/uL (ref 0.1–1.0)
Monocytes Relative: 4.8 % (ref 3.0–12.0)
Neutro Abs: 4.6 10*3/uL (ref 1.4–7.7)
Neutrophils Relative %: 57.8 % (ref 43.0–77.0)
Platelets: 359 10*3/uL (ref 150.0–400.0)
RBC: 4.56 Mil/uL (ref 3.87–5.11)
RDW: 12.2 % (ref 11.5–15.5)
WBC: 8 10*3/uL (ref 4.0–10.5)

## 2022-08-28 LAB — COMPREHENSIVE METABOLIC PANEL
ALT: 22 U/L (ref 0–35)
AST: 17 U/L (ref 0–37)
Albumin: 4.1 g/dL (ref 3.5–5.2)
Alkaline Phosphatase: 69 U/L (ref 39–117)
BUN: 14 mg/dL (ref 6–23)
CO2: 27 mEq/L (ref 19–32)
Calcium: 9.4 mg/dL (ref 8.4–10.5)
Chloride: 103 mEq/L (ref 96–112)
Creatinine, Ser: 0.88 mg/dL (ref 0.40–1.20)
GFR: 87.05 mL/min (ref >60.00)
Glucose, Bld: 82 mg/dL (ref 70–99)
Potassium: 4.4 mEq/L (ref 3.5–5.1)
Sodium: 139 mEq/L (ref 135–145)
Total Bilirubin: 0.3 mg/dL (ref 0.2–1.2)
Total Protein: 6.9 g/dL (ref 6.0–8.3)

## 2022-10-15 ENCOUNTER — Other Ambulatory Visit (HOSPITAL_COMMUNITY): Payer: Self-pay

## 2022-12-10 ENCOUNTER — Other Ambulatory Visit: Payer: Self-pay

## 2022-12-10 ENCOUNTER — Other Ambulatory Visit (HOSPITAL_COMMUNITY): Payer: Self-pay

## 2022-12-10 MED ORDER — NAPROXEN SODIUM 550 MG PO TABS
550.0000 mg | ORAL_TABLET | Freq: Two times a day (BID) | ORAL | 3 refills | Status: AC | PRN
  Filled 2022-12-10: qty 30, 15d supply, fill #0
  Filled 2023-06-26: qty 30, 15d supply, fill #1
  Filled 2023-09-17: qty 30, 15d supply, fill #2
  Filled 2023-12-09: qty 30, 15d supply, fill #3

## 2023-01-09 ENCOUNTER — Other Ambulatory Visit (HOSPITAL_COMMUNITY): Payer: Self-pay

## 2023-02-13 ENCOUNTER — Other Ambulatory Visit (HOSPITAL_COMMUNITY): Payer: Self-pay

## 2023-02-13 DIAGNOSIS — N946 Dysmenorrhea, unspecified: Secondary | ICD-10-CM | POA: Diagnosis not present

## 2023-02-13 DIAGNOSIS — Z124 Encounter for screening for malignant neoplasm of cervix: Secondary | ICD-10-CM | POA: Diagnosis not present

## 2023-02-13 DIAGNOSIS — Z1151 Encounter for screening for human papillomavirus (HPV): Secondary | ICD-10-CM | POA: Diagnosis not present

## 2023-02-13 DIAGNOSIS — Z01419 Encounter for gynecological examination (general) (routine) without abnormal findings: Secondary | ICD-10-CM | POA: Diagnosis not present

## 2023-02-13 DIAGNOSIS — Z6820 Body mass index (BMI) 20.0-20.9, adult: Secondary | ICD-10-CM | POA: Diagnosis not present

## 2023-02-13 MED ORDER — TARINA 24 FE 1-20 MG-MCG(24) PO TABS
1.0000 | ORAL_TABLET | Freq: Every day | ORAL | 4 refills | Status: DC
  Filled 2023-02-13 – 2023-04-05 (×2): qty 84, 84d supply, fill #0
  Filled 2023-06-26: qty 84, 84d supply, fill #1
  Filled 2023-09-17: qty 84, 84d supply, fill #2
  Filled 2023-12-09: qty 84, 84d supply, fill #3

## 2023-02-14 ENCOUNTER — Other Ambulatory Visit (HOSPITAL_COMMUNITY): Payer: Self-pay

## 2023-04-05 ENCOUNTER — Other Ambulatory Visit (HOSPITAL_COMMUNITY): Payer: Self-pay

## 2023-06-07 DIAGNOSIS — B070 Plantar wart: Secondary | ICD-10-CM | POA: Diagnosis not present

## 2023-06-07 DIAGNOSIS — D225 Melanocytic nevi of trunk: Secondary | ICD-10-CM | POA: Diagnosis not present

## 2023-06-07 DIAGNOSIS — L814 Other melanin hyperpigmentation: Secondary | ICD-10-CM | POA: Diagnosis not present

## 2023-06-07 DIAGNOSIS — L821 Other seborrheic keratosis: Secondary | ICD-10-CM | POA: Diagnosis not present

## 2023-06-18 DIAGNOSIS — L309 Dermatitis, unspecified: Secondary | ICD-10-CM | POA: Diagnosis not present

## 2023-06-18 DIAGNOSIS — Z7189 Other specified counseling: Secondary | ICD-10-CM | POA: Diagnosis not present

## 2023-07-12 ENCOUNTER — Ambulatory Visit: Admitting: Sports Medicine

## 2023-07-12 VITALS — BP 104/72 | Ht 62.0 in | Wt 110.0 lb

## 2023-07-12 DIAGNOSIS — M79671 Pain in right foot: Secondary | ICD-10-CM

## 2023-07-12 DIAGNOSIS — M79672 Pain in left foot: Secondary | ICD-10-CM | POA: Diagnosis not present

## 2023-07-12 NOTE — Progress Notes (Signed)
   PCP: Clayborne Dana, NP  SUBJECTIVE:   HPI:  Patient is a 33 y.o. female here with chief complaint of bilateral foot pain.   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 She has done really well with custom orthotics and presents today for a new pair. She has noticed her current ones from 2020 have broken down some and her pain is starting to return with long shifts/prolonged standing. No other complaints today.   Pertinent ROS were reviewed with the patient and found to be negative unless otherwise specified above in HPI.   PERTINENT  PMH / PSH / FH / SH:  Past Medical, Surgical, Social, and Family History Reviewed & Updated in the EMR.  Pertinent findings include:  See HPI for contributory PMH  Allergies  Allergen Reactions   Sulfa Antibiotics Itching and Rash    OBJECTIVE:  BP 104/72   Ht 5\' 2"  (1.575 m)   Wt 110 lb (49.9 kg)   BMI 20.12 kg/m   PHYSICAL EXAM:  GEN: Alert and Oriented, NAD, comfortable in exam room RESP: Unlabored respirations, symmetric chest rise PSY: normal mood, congruent affect   Bilateral Foot Exam: No visible erythema or swelling. Left foot wider than right. Mild pes planus. Transverse arch collapsed L>R. Two plantar warts at the base of the left heel currently being treated. FROM and 5/5 strength in all directions Non-tender on exam today. Gait analysis shows mild pronation of both feet, R>L.  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 She has done well with orthotics and metatarsal pads in the past. New pair made today as below. She may return in a few weeks for an additional pair as she likes to have multiple sets.  Patient was fitted for a: standard, cushioned, semi-rigid orthotic. The orthotic was heated and afterward the patient stood on the orthotic blank positioned on the orthotic  stand. The patient was positioned in subtalar neutral position and 10 degrees of ankle dorsiflexion in a weight bearing stance on the heated orthotic blank. After completion of molding, a stable base was applied to the orthotic blank. The blank was ground to a stable position for weight bearing. Size: 6 Base: Fit&Run Posting: None Additional orthotic padding: Small metatarsal pads in new pair, as well as updated the pads in her two previous pairs today.  Ambulated the halls in these and had correction of her pronation and noted comfort.   Sandra Salen, MD PGY-4, Sports Medicine Fellow [facility]

## 2023-07-24 ENCOUNTER — Ambulatory Visit (INDEPENDENT_AMBULATORY_CARE_PROVIDER_SITE_OTHER): Admitting: Sports Medicine

## 2023-07-24 VITALS — BP 110/60 | Ht 62.0 in

## 2023-07-24 DIAGNOSIS — M79671 Pain in right foot: Secondary | ICD-10-CM | POA: Diagnosis not present

## 2023-07-24 DIAGNOSIS — M79672 Pain in left foot: Secondary | ICD-10-CM | POA: Diagnosis not present

## 2023-07-24 NOTE — Progress Notes (Signed)
   Sandra Hart returns for another set of custom orthotics. See note from 2 weeks ago. One notable comment is that with the standard positioning of the small MT pads she had a lot of proximal forefoot discomfort, so requests they return much further up the forefoot where she previously had them. This was done for the new set today as well as her other 3 pairs.  Patient was fitted for a: standard, cushioned, semi-rigid orthotic. The orthotic was heated and afterward the patient stood on the orthotic blank positioned on the orthotic stand. The patient was positioned in subtalar neutral position and 10 degrees of ankle dorsiflexion in a weight bearing stance on the heated orthotic blank. After completion of molding, a stable base was applied to the orthotic blank. The blank was ground to a stable position for weight bearing. Size: 6 Base: Fit&Run Posting: None Additional orthotic padding: Small metatarsal pads B/L (more distal than standard location at pt's request).   Ambulated the halls in these and noted comfort.     [name], MD PGY-4, Sports Medicine Fellow [facility]

## 2023-08-28 ENCOUNTER — Encounter: Payer: Self-pay | Admitting: Family Medicine

## 2023-08-28 ENCOUNTER — Ambulatory Visit: Payer: Self-pay | Admitting: Family Medicine

## 2023-08-28 ENCOUNTER — Ambulatory Visit: Payer: 59 | Admitting: Family Medicine

## 2023-08-28 VITALS — BP 104/70 | HR 102 | Ht 62.0 in | Wt 114.0 lb

## 2023-08-28 DIAGNOSIS — Z13220 Encounter for screening for lipoid disorders: Secondary | ICD-10-CM

## 2023-08-28 DIAGNOSIS — Z0000 Encounter for general adult medical examination without abnormal findings: Secondary | ICD-10-CM

## 2023-08-28 LAB — COMPREHENSIVE METABOLIC PANEL WITH GFR
ALT: 26 U/L (ref 0–35)
AST: 18 U/L (ref 0–37)
Albumin: 4.4 g/dL (ref 3.5–5.2)
Alkaline Phosphatase: 72 U/L (ref 39–117)
BUN: 17 mg/dL (ref 6–23)
CO2: 29 meq/L (ref 19–32)
Calcium: 9.2 mg/dL (ref 8.4–10.5)
Chloride: 101 meq/L (ref 96–112)
Creatinine, Ser: 0.74 mg/dL (ref 0.40–1.20)
GFR: 106.42 mL/min (ref >60.00)
Glucose, Bld: 70 mg/dL (ref 70–99)
Potassium: 4.5 meq/L (ref 3.5–5.1)
Sodium: 136 meq/L (ref 135–145)
Total Bilirubin: 0.4 mg/dL (ref 0.2–1.2)
Total Protein: 6.9 g/dL (ref 6.0–8.3)

## 2023-08-28 LAB — CBC WITH DIFFERENTIAL/PLATELET
Basophils Absolute: 0 10*3/uL (ref 0.0–0.1)
Basophils Relative: 0.5 % (ref 0.0–3.0)
Eosinophils Absolute: 0.1 10*3/uL (ref 0.0–0.7)
Eosinophils Relative: 0.8 % (ref 0.0–5.0)
HCT: 42.2 % (ref 36.0–46.0)
Hemoglobin: 14.4 g/dL (ref 12.0–15.0)
Lymphocytes Relative: 33.3 % (ref 12.0–46.0)
Lymphs Abs: 2.3 10*3/uL (ref 0.7–4.0)
MCHC: 34.2 g/dL (ref 30.0–36.0)
MCV: 90.3 fl (ref 78.0–100.0)
Monocytes Absolute: 0.4 10*3/uL (ref 0.1–1.0)
Monocytes Relative: 5.8 % (ref 3.0–12.0)
Neutro Abs: 4.1 10*3/uL (ref 1.4–7.7)
Neutrophils Relative %: 59.6 % (ref 43.0–77.0)
Platelets: 346 10*3/uL (ref 150.0–400.0)
RBC: 4.67 Mil/uL (ref 3.87–5.11)
RDW: 12.3 % (ref 11.5–15.5)
WBC: 7 10*3/uL (ref 4.0–10.5)

## 2023-08-28 LAB — LIPID PANEL
Cholesterol: 151 mg/dL (ref 0–200)
HDL: 53.7 mg/dL (ref >39.00)
LDL Cholesterol: 75 mg/dL (ref 0–99)
NonHDL: 97.48
Total CHOL/HDL Ratio: 3
Triglycerides: 110 mg/dL (ref 0.0–149.0)
VLDL: 22 mg/dL (ref 0.0–40.0)

## 2023-08-28 LAB — TSH: TSH: 1.59 u[IU]/mL (ref 0.35–5.50)

## 2023-08-28 NOTE — Progress Notes (Signed)
 Complete physical exam  Patient: Sandra Hart   DOB: [birth year]   33 y.o. Female  MRN: [id number]  Subjective:    Chief Complaint  Patient presents with   Annual Exam    Sandra Hart is a 33 y.o. female who presents today for a complete physical exam. She reports consuming a general diet. Home exercise routine includes walking. She generally feels well. She reports sleeping well. She does not have additional problems to discuss today.   Currently lives with: parents Acute concerns or interim problems since last visit: no  Still works in the [employer]  Vision concerns: no concerns, sees ophthalmology regularly  Dental concerns: no concerns STD concerns: none  Patient endorses ETOH use. - social only  Patient denies nicotine use. Patient denies illegal substance use.    Females:  She is not currently  sexually active  She denies  concerns today about STI Contraception choices are: OCPs LMP: 08/23/23      Most recent fall risk assessment:    08/28/2023   10:09 AM  Fall Risk   Falls in the past year? 0  Number falls in past yr: 0  Injury with Fall? 0  Risk for fall due to : No Fall Risks  Follow up Falls evaluation completed     Most recent depression screenings:    08/28/2023   10:09 AM 08/27/2022    1:33 PM  PHQ 2/9 Scores  PHQ - 2 Score 0 0  PHQ- 9 Score 0 2          Patient Active Problem List   Diagnosis Date Noted   Nutritional counseling 11/27/2019   Migraine without aura and without status migrainosus, not intractable 03/24/2015   Past Medical History:  Diagnosis Date   Allergy    Heart murmur    Allergies  Allergen Reactions   Sulfa Antibiotics Itching and Rash      Patient Care Team: Everlina Hock, NP as PCP - General (Family Medicine)   Outpatient Medications Prior to Visit  Medication Sig   Biotin 1 MG CAPS biotin   fluticasone (FLONASE) 50 MCG/ACT nasal spray Place into both nostrils daily.   levocetirizine  (XYZAL) 5 MG tablet Take 5 mg by mouth every evening.   Multiple Vitamin (MULTIVITAMIN) tablet Take 1 tablet by mouth daily.   naproxen  sodium (ANAPROX ) 550 MG tablet Take 1 tablet (550 mg total) by mouth 2 (two) times daily as needed.   Norethindrone  Acetate-Ethinyl Estrad-FE (TARINA  24 FE) 1-20 MG-MCG(24) tablet Take 1 tablet by mouth daily.   Probiotic Product (PROBIOTIC BLEND PO) Take by mouth daily.   [DISCONTINUED] naproxen  sodium (ANAPROX ) 550 MG tablet TAKE 1 TABLET BY MOUTH TWO TIMES DAILY AS NEEDED   [DISCONTINUED] Norethin  Ace-Eth Estrad-FE (BLISOVI  24 FE PO) Take 1 mg by mouth every morning.   No facility-administered medications prior to visit.    ROS All review of systems negative except what is listed in the HPI         Objective:     BP 104/70   Pulse (!) 102   Ht 5\' 2"  (1.575 m)   Wt 114 lb (51.7 kg)   SpO2 100%   BMI 20.85 kg/m    Physical Exam Vitals reviewed.  Constitutional:      General: She is not in acute distress.    Appearance: Normal appearance. She is not ill-appearing.  HENT:     Head: Normocephalic and atraumatic.     Right  Ear: Tympanic membrane normal.     Left Ear: Tympanic membrane normal.     Nose: Nose normal.     Mouth/Throat:     Mouth: Mucous membranes are moist.     Pharynx: Oropharynx is clear.  Eyes:     Extraocular Movements: Extraocular movements intact.     Conjunctiva/sclera: Conjunctivae normal.     Pupils: Pupils are equal, round, and reactive to light.  Cardiovascular:     Rate and Rhythm: Normal rate and regular rhythm.     Pulses: Normal pulses.     Heart sounds: Normal heart sounds.  Pulmonary:     Effort: Pulmonary effort is normal.     Breath sounds: Normal breath sounds.  Abdominal:     General: Abdomen is flat. Bowel sounds are normal. There is no distension.     Palpations: Abdomen is soft. There is no mass.     Tenderness: There is no abdominal tenderness. There is no right CVA tenderness, left CVA  tenderness, guarding or rebound.  Genitourinary:    Comments: Deferred exam Musculoskeletal:        General: Normal range of motion.     Cervical back: Normal range of motion and neck supple. No tenderness.     Right lower leg: No edema.     Left lower leg: No edema.  Lymphadenopathy:     Cervical: No cervical adenopathy.  Skin:    General: Skin is warm and dry.     Capillary Refill: Capillary refill takes less than 2 seconds.  Neurological:     General: No focal deficit present.     Mental Status: She is alert and oriented to person, place, and time. Mental status is at baseline.  Psychiatric:        Mood and Affect: Mood normal.        Behavior: Behavior normal.        Thought Content: Thought content normal.        Judgment: Judgment normal.      No results found for any visits on 08/28/23.     Assessment & Plan:    Routine Health Maintenance and Physical Exam Discussed health promotion and safety including diet and exercise recommendations, dental health, and injury prevention. Tobacco cessation if applicable. Seat belts, sunscreen, smoke detectors, etc.    Immunization History  Administered Date(s) Administered   DTaP 07/24/1990, 09/25/1990, 11/27/1990, 09/15/1991, 09/24/1995   HIB (PRP-OMP) 07/24/1990, 09/25/1990, 11/27/1990, 09/15/1991   Hepatitis B, PED/ADOLESCENT 09/24/1995, 10/25/1995, 02/26/1996   IPV 07/24/1990, 09/25/1990, 09/15/1991, 09/24/1995   Influenza,inj,Quad PF,6+ Mos 01/27/2021   Influenza-Unspecified 01/15/2015, 01/28/2022   MMR 09/15/1991, 09/24/1995   PFIZER(Purple Top)SARS-COV-2 Vaccination 04/15/2019, 05/04/2019, 03/15/2020   PPD Test 11/09/2011, 11/20/2011   Pfizer Covid-19 Vaccine Bivalent Booster 105yrs & up 01/27/2021   Tdap 04/16/2008, 11/11/2019   Varicella 03/14/1994    Health Maintenance  Topic Date Due   COVID-19 Vaccine (5 - 2024-25 season) 08/26/2024 (Originally 12/16/2022)   INFLUENZA VACCINE  11/15/2023   Cervical Cancer  Screening (HPV/Pap Cotest)  02/03/2026   DTaP/Tdap/Td (8 - Td or Tdap) 11/10/2029   Hepatitis C Screening  Completed   HIV Screening  Completed   HPV VACCINES  Aged Out   Meningococcal B Vaccine  Aged Out     Problem List Items Addressed This Visit   None Visit Diagnoses       Annual physical exam    -  Primary   Relevant Orders   CBC with Differential/Platelet  Comprehensive metabolic panel with GFR   Lipid panel   TSH          Return in about 1 year (around 08/27/2024) for physical.     Everlina Hock, NP

## 2023-09-18 ENCOUNTER — Other Ambulatory Visit (HOSPITAL_COMMUNITY): Payer: Self-pay

## 2024-02-19 ENCOUNTER — Other Ambulatory Visit (HOSPITAL_COMMUNITY): Payer: Self-pay

## 2024-02-19 DIAGNOSIS — Z01419 Encounter for gynecological examination (general) (routine) without abnormal findings: Secondary | ICD-10-CM | POA: Diagnosis not present

## 2024-02-19 DIAGNOSIS — Z6820 Body mass index (BMI) 20.0-20.9, adult: Secondary | ICD-10-CM | POA: Diagnosis not present

## 2024-02-19 DIAGNOSIS — N946 Dysmenorrhea, unspecified: Secondary | ICD-10-CM | POA: Diagnosis not present

## 2024-02-19 MED ORDER — TARINA 24 FE 1-20 MG-MCG(24) PO TABS
1.0000 | ORAL_TABLET | Freq: Every day | ORAL | 3 refills | Status: AC
  Filled 2024-02-19 – 2024-03-04 (×2): qty 84, 84d supply, fill #0

## 2024-03-01 ENCOUNTER — Other Ambulatory Visit (HOSPITAL_COMMUNITY): Payer: Self-pay

## 2024-03-04 ENCOUNTER — Other Ambulatory Visit (HOSPITAL_COMMUNITY): Payer: Self-pay

## 2024-05-09 ENCOUNTER — Other Ambulatory Visit (HOSPITAL_COMMUNITY): Payer: Self-pay

## 2024-05-09 ENCOUNTER — Ambulatory Visit
Admission: EM | Admit: 2024-05-09 | Discharge: 2024-05-09 | Disposition: A | Discharge status: Home / Self Care | Attending: Nurse Practitioner | Admitting: Nurse Practitioner

## 2024-05-09 DIAGNOSIS — B9789 Other viral agents as the cause of diseases classified elsewhere: Secondary | ICD-10-CM | POA: Diagnosis not present

## 2024-05-09 DIAGNOSIS — J019 Acute sinusitis, unspecified: Secondary | ICD-10-CM | POA: Diagnosis not present

## 2024-05-09 DIAGNOSIS — R0981 Nasal congestion: Secondary | ICD-10-CM

## 2024-05-09 LAB — POC COVID19/FLU A&B COMBO
Covid Antigen, POC: NEGATIVE
Influenza A Antigen, POC: NEGATIVE
Influenza B Antigen, POC: NEGATIVE

## 2024-05-09 MED ORDER — PROMETHAZINE-DM 6.25-15 MG/5ML PO SYRP
5.0000 mL | ORAL_SOLUTION | Freq: Four times a day (QID) | ORAL | 0 refills | Status: DC | PRN
  Filled 2024-05-09: qty 118, 6d supply, fill #0

## 2024-05-09 MED ORDER — PREDNISONE 20 MG PO TABS
40.0000 mg | ORAL_TABLET | Freq: Every day | ORAL | 0 refills | Status: DC
  Filled 2024-05-09: qty 10, 5d supply, fill #0

## 2024-05-09 MED ORDER — FLUTICASONE PROPIONATE 50 MCG/ACT NA SUSP
2.0000 | Freq: Every day | NASAL | 0 refills | Status: AC
  Filled 2024-05-09: qty 16, 30d supply, fill #0

## 2024-05-09 MED ORDER — CETIRIZINE-PSEUDOEPHEDRINE ER 5-120 MG PO TB12
1.0000 | ORAL_TABLET | Freq: Every day | ORAL | 0 refills | Status: AC
  Filled 2024-05-09: qty 24, 24d supply, fill #0

## 2024-05-09 NOTE — Discharge Instructions (Addendum)
 You were seen today for sinus symptoms consistent with viral sinusitis. This means the infection is caused by a virus, not bacteria, so antibiotics are not needed at this time. Your treatment is focused on relieving congestion and inflammation to help your body heal. Take cetirizine -pseudoephedrine  daily to reduce congestion and pressure. Use Flonase  nasal spray daily as directed to decrease sinus and nasal inflammation. Begin the short prednisone  course as prescribed to help open the sinus passages and improve drainage. The promethazine -DM can be taken as needed for cough. At home, continue supportive care by drinking plenty of fluids, resting, using a humidifier or taking warm steamy showers to loosen mucus, and performing saline nasal rinses or sprays to help clear the nasal passages. Warm compresses to the face may reduce sinus pressure, and acetaminophen or ibuprofen can be used for headache or facial pain as needed if you normally tolerate these medications. Follow up with your primary care provider if symptoms do not improve within 5-7 days, worsen, persist beyond 10 days. Seek emergency care if you develop high or persistent fever, severe or worsening headache, vision changes, confusion, swelling or redness around the eyes or face, stiff neck, difficulty breathing, or any other rapidly worsening symptoms.

## 2024-05-09 NOTE — ED Triage Notes (Addendum)
 Pt present with c/o sinus infection x 4 days. Pt states she woke up this morning with a lot of green drainage. Pt states she took Mucinex this morning

## 2024-05-09 NOTE — ED Provider Notes (Signed)
 " [facility]    CSN: [id number] Arrival date & time: 05/09/24  9050      History   Chief Complaint No chief complaint on file.   HPI Sandra Hart is a 34 y.o. female.   Discussed the use of AI scribe software for clinical note transcription with the patient, who gave verbal consent to proceed.   Jenah presents with symptoms that she believes indicate her cold has progressed to a sinus infection. Her illness began 4 days ago with sore throat and post nasal drip. This morning she developed significant sinus pressure and pain along with dark green drainage that she is both blowing from her nose and coughing up. She reports nasal congestion and previously felt ear fullness which has since resolved. She experiences occasional nausea when taking medication. She denies fevers, chills, body aches, ear pain, wheezing, shortness of breath, vomiting, or diarrhea. She describes her discomfort as mainly sinus pressure rather than headaches. She has been treating her symptoms with Mucinex, DayQuil, and NyQuil. She reports exposure to her sister who had a cold the previous week and visited her, which she believes is the source of her illness. She does not smoke or vape.  The following sections of the patient's history were reviewed and updated as appropriate: allergies, current medications, past family history, past medical history, past social history, past surgical history, and problem list.     Past Medical History:  Diagnosis Date   Allergy    Heart murmur     Patient Active Problem List   Diagnosis Date Noted   Nutritional counseling 11/27/2019   Migraine without aura and without status migrainosus, not intractable 03/24/2015    Past Surgical History:  Procedure Laterality Date   NO PAST SURGERIES      OB History   No obstetric history on file.      Home Medications    Prior to Admission medications  Medication Sig Start Date End Date Taking? Authorizing  Provider  cetirizine -pseudoephedrine  (ZYRTEC -D) 5-120 MG tablet Take 1 tablet by mouth daily with breakfast for 10 days. 05/09/24 05/19/24 Yes Iola Lukes, FNP  fluticasone  (FLONASE ) 50 MCG/ACT nasal spray Place 2 sprays into both nostrils daily. Shake well before use. Gently blow nose before spraying. Do not blow nose immediately after use. You should not taste the medication or feel it going down your throat; if you do, adjust your technique. 05/09/24  Yes Kharis Lapenna, FNP  predniSONE  (DELTASONE ) 20 MG tablet Take 2 tablets (40 mg total) by mouth daily for 5 days. 05/09/24 05/14/24 Yes Kerrie Timm, FNP  promethazine -dextromethorphan (PROMETHAZINE -DM) 6.25-15 MG/5ML syrup Take 5 mLs by mouth 4 (four) times daily as needed for cough. 05/09/24  Yes Iola Lukes, FNP  Biotin 1 MG CAPS biotin    [provider]  Multiple Vitamin (MULTIVITAMIN) tablet Take 1 tablet by mouth daily.    [provider]  naproxen  sodium (ANAPROX ) 550 MG tablet Take 1 tablet (550 mg total) by mouth 2 (two) times daily as needed. 12/10/22     Norethindrone  Acetate-Ethinyl Estrad-FE (TARINA  24 FE) 1-20 MG-MCG(24) tablet Take 1 tablet by mouth daily. 02/19/24     Probiotic Product (PROBIOTIC BLEND PO) Take by mouth daily.    [provider]    Family History Family History  Problem Relation Age of Onset   Stroke Mother    Hypertension Mother    Atrial fibrillation Mother    Hypertension Father    Healthy Sister  Diabetes Maternal Grandfather    Cancer Maternal Grandfather    COPD Paternal Grandfather    Cancer Paternal Grandfather     Social History Social History[1]   Allergies   Sulfa antibiotics   Review of Systems Review of Systems  Constitutional:  Negative for chills and fever.  HENT:  Positive for congestion, postnasal drip, sinus pressure, sinus pain and sore throat. Negative for ear pain.   Respiratory:  Positive for cough. Negative for shortness of breath  and wheezing.   Gastrointestinal:  Positive for nausea (occasional). Negative for diarrhea and vomiting.  Musculoskeletal:  Negative for myalgias.  Neurological:  Negative for headaches.  All other systems reviewed and are negative.    Physical Exam Triage Vital Signs ED Triage Vitals  Encounter Vitals Group     BP 05/09/24 1119 119/75     Girls Systolic BP Percentile --      Girls Diastolic BP Percentile --      Boys Systolic BP Percentile --      Boys Diastolic BP Percentile --      Pulse Rate 05/09/24 1118 (!) 110     Resp 05/09/24 1118 18     Temp 05/09/24 1119 99 F (37.2 C)     Temp src --      SpO2 05/09/24 1118 97 %     Weight --      Height --      Head Circumference --      Peak Flow --      Pain Score 05/09/24 1117 0     Pain Loc --      Pain Education --      Exclude from Growth Chart --    No data found.  Updated Vital Signs BP 119/75   Pulse (!) 110   Temp 99 F (37.2 C)   Resp 18   LMP 05/01/2024 (Exact Date)   SpO2 97%   Visual Acuity Right Eye Distance:   Left Eye Distance:   Bilateral Distance:    Right Eye Near:   Left Eye Near:    Bilateral Near:     Physical Exam Vitals reviewed.  Constitutional:      General: She is awake. She is not in acute distress.    Appearance: Normal appearance. She is well-developed. She is not ill-appearing, toxic-appearing or diaphoretic.  HENT:     Head: Normocephalic.     Right Ear: Hearing, tympanic membrane, ear canal and external ear normal. No drainage, swelling or tenderness. No middle ear effusion. Tympanic membrane is not erythematous.     Left Ear: Hearing, tympanic membrane, ear canal and external ear normal. No drainage, swelling or tenderness.  No middle ear effusion. Tympanic membrane is not erythematous.     Nose: Congestion present.     Right Sinus: Maxillary sinus tenderness present. No frontal sinus tenderness.     Left Sinus: Maxillary sinus tenderness present. No frontal sinus  tenderness.     Mouth/Throat:     Lips: Pink.     Mouth: Mucous membranes are moist.     Pharynx: Oropharynx is clear. Uvula midline. No pharyngeal swelling, oropharyngeal exudate, posterior oropharyngeal erythema or uvula swelling.     Tonsils: No tonsillar exudate or tonsillar abscesses.  Eyes:     General: Vision grossly intact.     Conjunctiva/sclera: Conjunctivae normal.  Cardiovascular:     Rate and Rhythm: Normal rate and regular rhythm.     Heart sounds: Normal heart sounds.  Pulmonary:  Effort: Pulmonary effort is normal.     Breath sounds: Normal breath sounds and air entry.     Comments: Respirations even and unlabored  Musculoskeletal:        General: Normal range of motion.     Cervical back: Full passive range of motion without pain, normal range of motion and neck supple.  Lymphadenopathy:     Cervical: No cervical adenopathy.  Skin:    General: Skin is warm and dry.  Neurological:     General: No focal deficit present.     Mental Status: She is alert and oriented to person, place, and time.  Psychiatric:        Mood and Affect: Mood normal.        Behavior: Behavior normal. Behavior is cooperative.      UC Treatments / Results  Labs (all labs ordered are listed, but only abnormal results are displayed) Labs Reviewed  POC COVID19/FLU A&B COMBO    EKG   Radiology No results found.  Procedures Procedures (including critical care time)  Medications Ordered in UC Medications - No data to display  Initial Impression / Assessment and Plan / UC Course  I have reviewed the triage vital signs and the nursing notes.  Pertinent labs & imaging results that were available during my care of the patient were reviewed by me and considered in my medical decision making (see chart for details).     The patient presents with symptoms of congestion, runny nose, sinus pressure, sore throat, and headache --- most consistent with viral sinusitis. COVID testing  negative. Because the presentation suggests a viral rather than bacterial infection, supportive care is recommended, including hydration, rest, saline nasal rinses, humidifier use, warm compresses over the sinuses, and over-the-counter medications for symptom relief as appropriate. Antibiotics are not indicated at this time. The patient will be notified if the throat culture returns positive. Follow-up with a primary care provider is advised if symptoms worsen, persist beyond 7-10 days, or new concerns develop. Seek emergency care for difficulty breathing, severe facial swelling, high fever, or signs of systemic illness.  Today's evaluation has revealed no signs of a dangerous process. Discussed diagnosis with patient and/or guardian. Patient and/or guardian aware of their diagnosis, possible red flag symptoms to watch out for and need for close follow up. Patient and/or guardian understands verbal and written discharge instructions. Patient and/or guardian comfortable with plan and disposition.  Patient and/or guardian has a clear mental status at this time, good insight into illness (after discussion and teaching) and has clear judgment to make decisions regarding their care  Documentation was completed with the aid of voice recognition software. Transcription may contain typographical errors.  Final Clinical Impressions(s) / UC Diagnoses   Final diagnoses:  Nasal sinus congestion  Acute viral sinusitis     Discharge Instructions      You were seen today for sinus symptoms consistent with viral sinusitis. This means the infection is caused by a virus, not bacteria, so antibiotics are not needed at this time. Your treatment is focused on relieving congestion and inflammation to help your body heal. Take cetirizine -pseudoephedrine  daily to reduce congestion and pressure. Use Flonase  nasal spray daily as directed to decrease sinus and nasal inflammation. Begin the short prednisone  course as  prescribed to help open the sinus passages and improve drainage. The promethazine -DM can be taken as needed for cough. At home, continue supportive care by drinking plenty of fluids, resting, using a humidifier or taking warm  steamy showers to loosen mucus, and performing saline nasal rinses or sprays to help clear the nasal passages. Warm compresses to the face may reduce sinus pressure, and acetaminophen or ibuprofen can be used for headache or facial pain as needed if you normally tolerate these medications. Follow up with your primary care provider if symptoms do not improve within 5-7 days, worsen, persist beyond 10 days. Seek emergency care if you develop high or persistent fever, severe or worsening headache, vision changes, confusion, swelling or redness around the eyes or face, stiff neck, difficulty breathing, or any other rapidly worsening symptoms.      ED Prescriptions     Medication Sig Dispense Auth. Provider   cetirizine -pseudoephedrine  (ZYRTEC -D) 5-120 MG tablet Take 1 tablet by mouth daily with breakfast for 10 days. 10 tablet Iola Lukes, FNP   promethazine -dextromethorphan (PROMETHAZINE -DM) 6.25-15 MG/5ML syrup Take 5 mLs by mouth 4 (four) times daily as needed for cough. 118 mL Zenovia Justman, FNP   predniSONE  (DELTASONE ) 20 MG tablet Take 2 tablets (40 mg total) by mouth daily for 5 days. 10 tablet Iola Lukes, FNP   fluticasone  (FLONASE ) 50 MCG/ACT nasal spray Place 2 sprays into both nostrils daily. Shake well before use. Gently blow nose before spraying. Do not blow nose immediately after use. You should not taste the medication or feel it going down your throat; if you do, adjust your technique. 16 g Iola Lukes, FNP      PDMP not reviewed this encounter.     [1]  Social History Tobacco Use   Smoking status: Never   Smokeless tobacco: Never  Substance Use Topics   Alcohol use: Yes    Alcohol/week: 2.0 standard drinks of alcohol    Types: 1  Glasses of wine, 1 Cans of beer per week   Drug use: Never     Iola Lukes, FNP 05/09/24 1214  "

## 2024-05-13 ENCOUNTER — Encounter: Payer: Self-pay | Admitting: Family Medicine

## 2024-05-13 DIAGNOSIS — J019 Acute sinusitis, unspecified: Secondary | ICD-10-CM

## 2024-05-15 ENCOUNTER — Other Ambulatory Visit (HOSPITAL_COMMUNITY): Payer: Self-pay

## 2024-05-15 ENCOUNTER — Ambulatory Visit: Admitting: Family Medicine

## 2024-05-15 MED ORDER — AMOXICILLIN-POT CLAVULANATE 875-125 MG PO TABS
1.0000 | ORAL_TABLET | Freq: Two times a day (BID) | ORAL | 0 refills | Status: AC
  Filled 2024-05-15: qty 14, 7d supply, fill #0

## 2024-09-10 ENCOUNTER — Encounter: Admitting: Family Medicine
